# Patient Record
Sex: Female | Born: 1995 | Hispanic: No | Marital: Single | Smoking: Never smoker
Health system: Southern US, Community
[De-identification: ages and names within clinical notes are randomized; demographics above are authoritative.]

## PROBLEM LIST (undated history)

## (undated) DIAGNOSIS — R109 Unspecified abdominal pain: Secondary | ICD-10-CM

## (undated) DIAGNOSIS — N939 Abnormal uterine and vaginal bleeding, unspecified: Secondary | ICD-10-CM

## (undated) HISTORY — DX: Unspecified abdominal pain: R10.9

## (undated) HISTORY — DX: Abnormal uterine and vaginal bleeding, unspecified: N93.9

---
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
## 2015-03-10 ENCOUNTER — Ambulatory Visit: Payer: Medicaid Other | Admitting: Pediatrics

## 2015-03-31 ENCOUNTER — Ambulatory Visit: Payer: Medicaid Other | Admitting: Pediatrics

## 2015-03-31 ENCOUNTER — Encounter: Payer: Self-pay | Admitting: Pediatrics

## 2015-03-31 NOTE — Progress Notes (Unsigned)
Pre-Visit Planning  Destynie Toomey  is a 20 y.o. female referred by Venia Minks, MD.   Last seen in Adolescent Medicine Clinic on 10/12/ for ***.   Previous Psych Screenings? {Yes/No-Ex:120004}  Treatment plan at last visit included ***.   Clinical Staff Visit Tasks:   - Urine GC/CT due? {Yes/No-Ex:120004} - Psych Screenings Due? {Yes/No-Ex:120004} - ***  Provider Visit Tasks: - *** [facility] Involvement? {Responses; yes/no/unknown/maybe/na:33144} - Pertinent Labs? {Yes/No-Ex:120004}

## 2015-09-24 ENCOUNTER — Encounter: Payer: Self-pay | Admitting: Pediatrics

## 2015-09-25 ENCOUNTER — Encounter: Payer: Self-pay | Admitting: Pediatrics

## 2015-12-25 ENCOUNTER — Encounter: Payer: Self-pay | Admitting: Pediatrics

## 2015-12-25 ENCOUNTER — Ambulatory Visit (INDEPENDENT_AMBULATORY_CARE_PROVIDER_SITE_OTHER): Payer: Medicaid Other | Admitting: Pediatrics

## 2015-12-25 VITALS — BP 120/69 | HR 76 | Ht 63.0 in | Wt 155.0 lb

## 2015-12-25 DIAGNOSIS — Z978 Presence of other specified devices: Secondary | ICD-10-CM

## 2015-12-25 DIAGNOSIS — Z113 Encounter for screening for infections with a predominantly sexual mode of transmission: Secondary | ICD-10-CM

## 2015-12-25 DIAGNOSIS — N921 Excessive and frequent menstruation with irregular cycle: Secondary | ICD-10-CM | POA: Diagnosis not present

## 2015-12-25 DIAGNOSIS — Z130 Encounter for screening for diseases of the blood and blood-forming organs and certain disorders involving the immune mechanism: Secondary | ICD-10-CM

## 2015-12-25 DIAGNOSIS — Z3046 Encounter for surveillance of implantable subdermal contraceptive: Secondary | ICD-10-CM

## 2015-12-25 DIAGNOSIS — Z975 Presence of (intrauterine) contraceptive device: Principal | ICD-10-CM

## 2015-12-25 LAB — POCT HEMOGLOBIN: Hemoglobin: 13.2 g/dL (ref 12.2–16.2)

## 2015-12-25 MED ORDER — NORETHINDRONE ACET-ETHINYL EST 1.5-30 MG-MCG PO TABS: 1.0000 | ORAL_TABLET | Freq: Every day | ORAL | 2 refills | Status: DC

## 2015-12-25 NOTE — Patient Instructions (Signed)
Call us if you need us.  If bleeding doesn't stop, let us know.  I will call you with labs tomorrow

## 2015-12-25 NOTE — Progress Notes (Signed)
THIS RECORD MAY CONTAIN CONFIDENTIAL INFORMATION THAT SHOULD NOT BE RELEASED WITHOUT REVIEW OF THE SERVICE PROVIDER.  Adolescent Medicine Consultation Follow-Up Visit Nicole Ochoa  is a 20 y.o. female referred by Marijo File, MD here today for follow-up regarding nexplanon.    Last seen in Adolescent Medicine Clinic on 12/11/14 for breakthrough bleeding with nexplanon.  Plan at last visit included start junel for 2 months for uterine stabilization.  - Pertinent Labs? No - Growth Chart Viewed? yes   History was provided by the patient.  PCP Confirmed?  yes  My Chart Activated?   no   Chief Complaint  Patient presents with  . Follow-up  . Vaginal Bleeding    HPI:    Breakthrough bleeding started again 2 months ago. She describes it as really heavy-- 4 pads a day every day. For the last two weeks or so she has had cramping. She had about 4 months with no bleeding.  Not sexually active at all. She describes that sometimes there is a smell.  The pills helped last time for about a month and then ran out.   Review of Systems  Constitutional: Negative for malaise/fatigue.  Eyes: Negative for double vision.  Respiratory: Negative for shortness of breath.   Cardiovascular: Negative for chest pain and palpitations.  Gastrointestinal: Negative for abdominal pain, constipation, diarrhea, nausea and vomiting.  Genitourinary: Negative for dysuria.  Musculoskeletal: Negative for joint pain and myalgias.  Skin: Negative for rash.  Neurological: Negative for dizziness and headaches.  Endo/Heme/Allergies: Does not bruise/bleed easily.     No LMP recorded. No Known Allergies Outpatient Medications Prior to Visit  Medication Sig Dispense Refill  . ibuprofen (ADVIL,MOTRIN) 600 MG tablet Take 1 tablet (600 mg total) by mouth every 6 (six) hours as needed for cramping. 30 tablet 0  . Norethindrone Acetate-Ethinyl Estradiol (JUNEL,LOESTRIN,MICROGESTIN) 1.5-30 MG-MCG tablet Take 1 tablet  by mouth daily. (Patient not taking: Reported on 12/25/2015) 1 Package 2   No facility-administered medications prior to visit.      Patient Active Problem List   Diagnosis Date Noted  . Breakthrough bleeding on Nexplanon 12/25/2015  . Surveillance of previously prescribed implantable subdermal contraceptive 12/11/2014  . Abdominal pain, unspecified site 06/29/2013  . Unspecified constipation 06/29/2013  . Episode of heavy vaginal bleeding 05/30/2013     The following portions of the patient's history were reviewed and updated as appropriate: allergies, current medications, past family history, past medical history, past social history and problem list.  Physical Exam:  Vitals:   12/25/15 1545  BP: 120/69  Pulse: 76  Weight: 155 lb (70.3 kg)  Height: 5\' 3"  (1.6 m)   BP 120/69   Pulse 76   Ht 5\' 3"  (1.6 m)   Wt 155 lb (70.3 kg)   BMI 27.46 kg/m  Body mass index: body mass index is 27.46 kg/m. Growth percentile SmartLinks can only be used for patients less than 5 years old.   Physical Exam  Constitutional: She appears well-developed. No distress.  HENT:  Mouth/Throat: Oropharynx is clear and moist.  Neck: No thyromegaly present.  Cardiovascular: Normal rate and regular rhythm.   No murmur heard. Pulmonary/Chest: Breath sounds normal.  Abdominal: Soft. She exhibits no mass. There is no tenderness. There is no guarding.  Musculoskeletal: She exhibits no edema.  Lymphadenopathy:    She has no cervical adenopathy.  Neurological: She is alert.  Skin: Skin is warm. No rash noted.  Psychiatric: She has a normal mood and affect.  Nursing note and vitals reviewed.   Assessment/Plan: 1. Breakthrough bleeding on Nexplanon BTB has returned. Will screen for infection and treat with another round of OCP. Patient is satisfied with this.  - WET PREP BY MOLECULAR PROBE - Norethindrone Acetate-Ethinyl Estradiol (JUNEL,LOESTRIN,MICROGESTIN) 1.5-30 MG-MCG tablet; Take 1 tablet by  mouth daily.  Dispense: 1 Package; Refill: 2  2. Surveillance of previously prescribed implantable subdermal contraceptive Patient satisfied with contraceptive device just wants to stop bleeding again.  - POCT hemoglobin  3. Routine screening for STI (sexually transmitted infection) Per BTB protocol.  - GC/Chlamydia Probe Amp  4. Screening for iron deficiency anemia Hemoglobin normal- not bleeding too heavily. Will monitor.    Follow-up:  PRN if bleeding does not improve or returns.   Medical decision-making:  >15 minutes spent face to face with patient with more than 50% of appointment spent discussing diagnosis, management, follow-up, and reviewing the plan of care as noted above.

## 2015-12-26 LAB — GC/CHLAMYDIA PROBE AMP
CT PROBE, AMP APTIMA: NOT DETECTED
GC Probe RNA: NOT DETECTED

## 2015-12-26 LAB — WET PREP BY MOLECULAR PROBE
CANDIDA SPECIES: NEGATIVE
Gardnerella vaginalis: NEGATIVE
TRICHOMONAS VAG: NEGATIVE

## 2015-12-29 NOTE — Progress Notes (Signed)
TC to mobile phone number x2.  No longer in service. Unable to LVM.   TC to home phone.  Female picked up, stating pt no longer available at this phone number.

## 2016-02-20 ENCOUNTER — Other Ambulatory Visit: Payer: Self-pay | Admitting: Pediatrics

## 2016-02-20 ENCOUNTER — Ambulatory Visit (INDEPENDENT_AMBULATORY_CARE_PROVIDER_SITE_OTHER): Payer: Medicaid Other | Admitting: Pediatrics

## 2016-02-20 ENCOUNTER — Encounter: Payer: Self-pay | Admitting: Pediatrics

## 2016-02-20 VITALS — BP 110/65 | Wt 155.4 lb

## 2016-02-20 DIAGNOSIS — N921 Excessive and frequent menstruation with irregular cycle: Secondary | ICD-10-CM

## 2016-02-20 DIAGNOSIS — R109 Unspecified abdominal pain: Secondary | ICD-10-CM | POA: Diagnosis not present

## 2016-02-20 DIAGNOSIS — R5383 Other fatigue: Secondary | ICD-10-CM

## 2016-02-20 DIAGNOSIS — Z3202 Encounter for pregnancy test, result negative: Secondary | ICD-10-CM

## 2016-02-20 DIAGNOSIS — Z978 Presence of other specified devices: Secondary | ICD-10-CM | POA: Diagnosis not present

## 2016-02-20 DIAGNOSIS — N926 Irregular menstruation, unspecified: Secondary | ICD-10-CM

## 2016-02-20 DIAGNOSIS — Z975 Presence of (intrauterine) contraceptive device: Principal | ICD-10-CM

## 2016-02-20 LAB — POCT HEMOGLOBIN: HEMOGLOBIN: 13.6 g/dL (ref 12.2–16.2)

## 2016-02-20 LAB — POCT URINE PREGNANCY: Preg Test, Ur: NEGATIVE

## 2016-02-20 MED ORDER — NORETHINDRONE ACET-ETHINYL EST 1.5-30 MG-MCG PO TABS: 1.0000 | ORAL_TABLET | Freq: Every day | ORAL | 2 refills | Status: DC

## 2016-02-20 MED ORDER — IBUPROFEN 600 MG PO TABS: 600.0000 mg | ORAL_TABLET | Freq: Four times a day (QID) | ORAL | 0 refills | Status: DC | PRN

## 2016-02-20 NOTE — Progress Notes (Signed)
    Subjective:    Nicole Ochoa is a 20 y.o. female accompanied by self presenting to the clinic today with a chief c/o of Chief Complaint  Patient presents with  . Menstrual Problem    pt stated that she has been bleeding x2weeks    Nexplanon implant followed by Nicole Ochoa  No follow up scheduled per patient.  Bleeding daily x 2 weeks, using 3 pads per day.  Gradual decrease in the bleeding  Finished Junel prescription 2 weeks ago. Low back cramps from  Vaginal bleeding Feeling tired x 2 weeks since onset of vaginal bleeing.  Last Hbg 10/17  13.2  Denies fever or illness.  Appetite is normal.  No dysuria.  Denies sexual activity.   Review of Systems  Constitutional: Negative.   HENT: Negative.   Eyes: Negative.   Respiratory: Negative.   Cardiovascular: Negative.   Musculoskeletal: Negative.   Skin: Negative.   Neurological: Negative.   Psychiatric/Behavioral: Negative.        Objective:   Physical Exam  Constitutional: She is oriented to person, place, and time. She appears well-developed and well-nourished.  HENT:  Head: Normocephalic and atraumatic.  Right Ear: External ear normal.  Left Ear: External ear normal.  Nose: Nose normal.  Mouth/Throat: Oropharynx is clear and moist.  Eyes: Conjunctivae are normal. Pupils are equal, round, and reactive to light.  Neck: Normal range of motion. Neck supple.  Cardiovascular: Normal rate, regular rhythm and normal heart sounds.   No murmur heard. Pulmonary/Chest: Effort normal and breath sounds normal. No respiratory distress. She has no rales.  Abdominal: Soft. Bowel sounds are normal. She exhibits no mass. There is no tenderness.  Musculoskeletal:  Denies LS pain  Lymphadenopathy:    She has no cervical adenopathy.  Neurological: She is alert and oriented to person, place, and time.  Skin: Skin is warm and dry. No rash noted.  Nexplanon palpated in left upper arm ~ 5 cm above elbow  Psychiatric: She has a  normal mood and affect. Her behavior is normal.  Nursing note and vitals reviewed.  .BP 110/65   Wt 155 lb 6.4 oz (70.5 kg)   LMP 02/06/2016   BMI 27.53 kg/m         Assessment & Plan:  There are no diagnoses linked to this encounter. 1. Breakthrough bleeding on Nexplanon Restart, refill sent - Norethindrone Acetate-Ethinyl Estradiol (JUNEL,LOESTRIN,MICROGESTIN) 1.5-30 MG-MCG tablet; Take 1 tablet by mouth daily.  Dispense: 1 Package; Refill: 2  2. Irregular menses - POCT urine pregnancy - negative; reviewed results Restart, refill sent - Norethindrone Acetate-Ethinyl Estradiol (JUNEL,LOESTRIN,MICROGESTIN) 1.5-30 MG-MCG tablet; Take 1 tablet by mouth daily.  Dispense: 1 Package; Refill: 2  3. Abdominal cramping Instructed to take as needed with food to minimize gastic discomfort - ibuprofen (ADVIL,MOTRIN) 600 MG tablet; Take 1 tablet (600 mg total) by mouth every 6 (six) hours as needed for cramping.  Dispense: 30 tablet; Refill: 0  4. Other fatigue - POCT hemoglobin, 13.6, fatigue not due to excessive blood loss from vaginal bleeding. Encouraged adequate rest.  Follow up with C. Millikin for discussion about management plan for breakthrough bleeding in next 2-4 weeks.   Nicole Ochoa verbalizes understanding with plan and questions addressed.  Nicole CasinoLaura Anastasios Melander MSN, CPNP, CDE 02/20/2016 5:02 PM

## 2016-02-20 NOTE — Patient Instructions (Signed)
Junel - restart and take one pill daily  Motrin 600 mg may take 1 every 8 hours with food for abdominal cramps.  Keep appointment with Garth Schlatterhristy Millikan

## 2016-03-23 ENCOUNTER — Ambulatory Visit: Payer: Self-pay | Admitting: Family

## 2016-05-18 ENCOUNTER — Encounter: Payer: Self-pay | Admitting: Pediatrics

## 2016-05-18 ENCOUNTER — Ambulatory Visit (INDEPENDENT_AMBULATORY_CARE_PROVIDER_SITE_OTHER): Payer: Medicaid Other | Admitting: Pediatrics

## 2016-05-18 VITALS — BP 86/49 | HR 84 | Ht 63.5 in | Wt 154.8 lb

## 2016-05-18 DIAGNOSIS — Z978 Presence of other specified devices: Secondary | ICD-10-CM

## 2016-05-18 DIAGNOSIS — N921 Excessive and frequent menstruation with irregular cycle: Secondary | ICD-10-CM

## 2016-05-18 DIAGNOSIS — Z975 Presence of (intrauterine) contraceptive device: Principal | ICD-10-CM

## 2016-05-18 DIAGNOSIS — N926 Irregular menstruation, unspecified: Secondary | ICD-10-CM

## 2016-05-18 DIAGNOSIS — Z130 Encounter for screening for diseases of the blood and blood-forming organs and certain disorders involving the immune mechanism: Secondary | ICD-10-CM

## 2016-05-18 LAB — POCT HEMOGLOBIN: Hemoglobin: 13.3 g/dL (ref 12.2–16.2)

## 2016-05-18 MED ORDER — NORETHINDRONE ACET-ETHINYL EST 1.5-30 MG-MCG PO TABS: 1.0000 | ORAL_TABLET | Freq: Every day | ORAL | 2 refills | Status: DC

## 2016-05-18 NOTE — Progress Notes (Signed)
THIS RECORD MAY CONTAIN CONFIDENTIAL INFORMATION THAT SHOULD NOT BE RELEASED WITHOUT REVIEW OF THE SERVICE PROVIDER.  Adolescent Medicine Consultation Follow-Up Visit Nicole Ochoa  is a 21 y.o. female referred by Nicole Ochoa, Nicole V, MD here today for follow-up regarding breakthrough bleeding with nexplanon   Last seen in [facility] on 12/25/2015 for the above.  Plan at last visit included continue Junel for BTB. Was then seen by Pixie CasinoLaura Ochoa for the same in December. Was having fatigue but hgb was normal at 13.7.  - Pertinent Labs? No - Growth Chart Viewed? yes   History was provided by the patient.  PCP Confirmed?  yes  My Chart Activated?   no  Chief Complaint  Patient presents with  . Follow-up  . reproductive health    HPI:    Continues with BTB on nexplanon. Still happy with method but would like to continue OCP to manage bleeding.  Not sexually active and has not been since last visit.  No other vaginal symptoms.  Is in school for medical assistant.   Review of Systems  Constitutional: Negative for malaise/fatigue.  Eyes: Negative for double vision.  Respiratory: Negative for shortness of breath.   Cardiovascular: Negative for chest pain and palpitations.  Gastrointestinal: Negative for abdominal pain, constipation, diarrhea, nausea and vomiting.  Genitourinary: Negative for dysuria.  Musculoskeletal: Negative for joint pain and myalgias.  Skin: Negative for rash.  Neurological: Negative for dizziness and headaches.  Endo/Heme/Allergies: Does not bruise/bleed easily.     Patient's last menstrual period was 04/28/2016. No Known Allergies Outpatient Medications Prior to Visit  Medication Sig Dispense Refill  . ibuprofen (ADVIL,MOTRIN) 600 MG tablet Take 1 tablet (600 mg total) by mouth every 6 (six) hours as needed for cramping. 30 tablet 0  . Norethindrone Acetate-Ethinyl Estradiol (JUNEL,LOESTRIN,MICROGESTIN) 1.5-30 MG-MCG tablet Take 1 tablet  by mouth daily. (Patient not taking: Reported on 05/18/2016) 1 Package 2   No facility-administered medications prior to visit.      Patient Active Problem List   Diagnosis Date Noted  . Breakthrough bleeding on Nexplanon 12/25/2015  . Surveillance of previously prescribed implantable subdermal contraceptive 12/11/2014  . Abdominal pain, unspecified site 06/29/2013  . Unspecified constipation 06/29/2013  . Episode of heavy vaginal bleeding 05/30/2013    The following portions of the patient's history were reviewed and updated as appropriate: allergies, current medications, past family history, past medical history, past social history and problem list.  Physical Exam:  Vitals:   05/18/16 0849  Weight: 154 lb 12.8 oz (70.2 kg)  Height: 5' 3.5" (1.613 m)   Ht 5' 3.5" (1.613 m)   Wt 154 lb 12.8 oz (70.2 kg)   LMP 04/28/2016   BMI 26.99 kg/m  Body mass index: body mass index is 26.99 kg/m. Growth percentile SmartLinks can only be used for patients less than 21 years old.   Physical Exam  Constitutional: She appears well-developed. No distress.  HENT:  Mouth/Throat: Oropharynx is clear and moist.  Neck: No thyromegaly present.  Cardiovascular: Normal rate and regular rhythm.   No murmur heard. Pulmonary/Chest: Breath sounds normal.  Abdominal: Soft. She exhibits no mass. There is no tenderness. There is no guarding.  Musculoskeletal: She exhibits no edema.  Lymphadenopathy:    She has no cervical adenopathy.  Neurological: She is alert.  Skin: Skin is warm. No rash noted.  Psychiatric: She has a normal mood and affect.  Nursing note and vitals reviewed.   Assessment/Plan: 1. Breakthrough bleeding on Nexplanon Continue  OCP. No concerns for infection. We discussed other methods but she would like to continue with this for now.  - Norethindrone Acetate-Ethinyl Estradiol (JUNEL,LOESTRIN,MICROGESTIN) 1.5-30 MG-MCG tablet; Take 1 tablet by mouth daily.  Dispense: 3 Package;  Refill: 2  2. Irregular menses As above.  - Norethindrone Acetate-Ethinyl Estradiol (JUNEL,LOESTRIN,MICROGESTIN) 1.5-30 MG-MCG tablet; Take 1 tablet by mouth daily.  Dispense: 3 Package; Refill: 2  3. Screening for iron deficiency anemia Hgb 13.3.    Follow-up:  As needed for BTB or method change   Medical decision-making:  >15 minutes spent face to face with patient with more than 50% of appointment spent discussing diagnosis, management, follow-up, and reviewing of breakthrough bleeding on nexplanon.

## 2016-05-18 NOTE — Patient Instructions (Signed)
Continue taking birth control pills for bleeding.  If you decide you want to take nexplanon out and try a different method let us know.

## 2017-03-21 ENCOUNTER — Ambulatory Visit (INDEPENDENT_AMBULATORY_CARE_PROVIDER_SITE_OTHER): Payer: Medicaid Other | Admitting: Pediatrics

## 2017-03-21 ENCOUNTER — Encounter: Payer: Self-pay | Admitting: Pediatrics

## 2017-03-21 VITALS — BP 113/64 | HR 66 | Ht 63.78 in | Wt 168.6 lb

## 2017-03-21 DIAGNOSIS — N921 Excessive and frequent menstruation with irregular cycle: Secondary | ICD-10-CM

## 2017-03-21 DIAGNOSIS — Z113 Encounter for screening for infections with a predominantly sexual mode of transmission: Secondary | ICD-10-CM | POA: Diagnosis not present

## 2017-03-21 DIAGNOSIS — Z3046 Encounter for surveillance of implantable subdermal contraceptive: Secondary | ICD-10-CM | POA: Diagnosis not present

## 2017-03-21 DIAGNOSIS — N946 Dysmenorrhea, unspecified: Secondary | ICD-10-CM

## 2017-03-21 DIAGNOSIS — Z978 Presence of other specified devices: Secondary | ICD-10-CM | POA: Diagnosis not present

## 2017-03-21 DIAGNOSIS — Z130 Encounter for screening for diseases of the blood and blood-forming organs and certain disorders involving the immune mechanism: Secondary | ICD-10-CM

## 2017-03-21 DIAGNOSIS — Z975 Presence of (intrauterine) contraceptive device: Secondary | ICD-10-CM

## 2017-03-21 LAB — POCT HEMOGLOBIN: HEMOGLOBIN: 15.9 g/dL (ref 12.2–16.2)

## 2017-03-21 MED ORDER — NAPROXEN 500 MG PO TABS: 500.0000 mg | ORAL_TABLET | Freq: Two times a day (BID) | ORAL | 2 refills | Status: DC

## 2017-03-21 NOTE — Patient Instructions (Signed)
Follow-up with Dr. Perry in 1 month. Schedule this appointment before you leave clinic today.  Your Nexplanon was removed today and is no longer preventing pregnancy.  If you have sex, remember to use condoms to prevent pregnancy and to prevent sexually transmitted infections.  Leave the outside bandage on for 24 hours.  Leave the smaller bandages on for 3-5 days or until they fall off on their own.  Keep the area clean and dry for 3-5 days.  There is usually bruising or swelling at and around the removal site for a few days to a week after the removal.  If you see redness or pus draining from the removal site, call us immediately.  We would like you to return to the clinic for a follow-up visit in 1 month.  You can call [facility] 24 hours a day with any questions or concerns.  There is always a nurse or doctor available to take your call.  Call 9-1-1 if you have a life-threatening emergency.  For anything else, please call us at [phone] before heading to the ER.  

## 2017-03-21 NOTE — Progress Notes (Signed)
THIS RECORD MAY CONTAIN CONFIDENTIAL INFORMATION THAT SHOULD NOT BE RELEASED WITHOUT REVIEW OF THE SERVICE PROVIDER.  Adolescent Medicine Consultation Follow-Up Visit Nicole Ochoa  is a 22 y.o. female referred by Marijo File, MD here today for follow-up regarding breakthrough bleeding.    Last seen in [facility] Clinic on 05/18/16 for breakthrough bleeding on nexplanon.  Plan at last visit included continued OCP in addition to nexplanon, follow up as needed.  Pertinent Labs? Yes, hemoglobin normal Growth Chart Viewed? yes   History was provided by the patient.  Interpreter? yes  PCP Confirmed?  yes  My Chart Activated?   no   Chief Complaint  Patient presents with  . Follow-up    prlonged bleeding and upper arm pain    HPI:    Presents w/ complaints of BTB on nexplanon. She has been having 2 weeks of bleeding on her most recent period. First week she was using 3 tampons/day, this week using 2 tampons/day. No leaking through these. She also complaints of low back pain, weight gain (30 pounds since nexplanon put in), arm pain, and mood swings. She would like nexplanon removed at this point. She is unsure about other forms of birth control going forward. She does not want any of these side effects to continue. She is not currently sexually active. The birth control was always in place for heavy bleeding on her menses.   No LMP recorded. No Known Allergies Outpatient Medications Prior to Visit  Medication Sig Dispense Refill  . ibuprofen (ADVIL,MOTRIN) 600 MG tablet Take 1 tablet (600 mg total) by mouth every 6 (six) hours as needed for cramping. 30 tablet 0  . Norethindrone Acetate-Ethinyl Estradiol (JUNEL,LOESTRIN,MICROGESTIN) 1.5-30 MG-MCG tablet Take 1 tablet by mouth daily. 3 Package 2   No facility-administered medications prior to visit.      Patient Active Problem List   Diagnosis Date Noted  . Breakthrough bleeding on Nexplanon 12/25/2015  . Surveillance  of previously prescribed implantable subdermal contraceptive 12/11/2014  . Abdominal pain, unspecified site 06/29/2013  . Unspecified constipation 06/29/2013    Social History: Changes with school since last visit?  yes, in MA program  The following portions of the patient's history were reviewed and updated as appropriate: allergies, current medications, past family history, past medical history, past social history, past surgical history and problem list.  Physical Exam:  Vitals:   03/21/17 1110  BP: 113/64  Pulse: 66  Weight: 168 lb 9.6 oz (76.5 kg)  Height: 5' 3.78" (1.62 m)   BP 113/64   Pulse 66   Ht 5' 3.78" (1.62 m)   Wt 168 lb 9.6 oz (76.5 kg)   BMI 29.14 kg/m  Body mass index: body mass index is 29.14 kg/m. Growth percentile SmartLinks can only be used for patients less than 15 years old.  Physical Exam  Constitutional: She is oriented to person, place, and time. She appears well-developed and well-nourished. No distress.  HENT:  Head: Normocephalic and atraumatic.  Eyes: Conjunctivae are normal. No scleral icterus.  Cardiovascular: Normal rate and regular rhythm.  Pulmonary/Chest: Effort normal. No respiratory distress.  Musculoskeletal: Normal range of motion.       Lumbar back: She exhibits tenderness.  Neurological: She is alert and oriented to person, place, and time. No cranial nerve deficit.  Skin: Skin is warm and dry.  Psychiatric: She has a normal mood and affect.    Assessment/Plan: 1. Breakthrough bleeding on Nexplanon Nexplanon removed today in office. Will trial 6-8  weeks off birth control to get a sense of bleeding at baseline.  -return to be seen if heavy bleeding occurs (>6 pads/tampon a day) -[facility] -follow up 6-8 weeks to discuss need for other birth control if heavy bleeding persists; hormone patch or ring may be good options vs IUD if patient is amenable   2. Routine screening for STI (sexually transmitted  infection) Patient not sexually active - C. trachomatis/N. gonorrhoeae RNA  3. Screening for deficiency anemia Hemoglobin in normal range today. - POCT hemoglobin  4. Dysmenorrhea Cramping on period.  - naproxen (NAPROSYN) 500 MG tablet; Take 1 tablet (500 mg total) by mouth 2 (two) times daily with a meal.  Dispense: 60 tablet; Refill: 2   Follow-up:  Return in about 6 weeks (around 05/02/2017).   Medical decision-making:  >25 minutes spent face to face with patient with more than 50% of appointment spent discussing diagnosis, management, follow-up, and reviewing of breakthrough bleeding on nexplanon.

## 2017-07-13 ENCOUNTER — Ambulatory Visit (HOSPITAL_COMMUNITY)
Admission: EM | Admit: 2017-07-13 | Discharge: 2017-07-13 | Disposition: A | Discharge status: Home / Self Care | Payer: Self-pay | Attending: Family Medicine | Admitting: Family Medicine

## 2017-07-13 ENCOUNTER — Other Ambulatory Visit: Payer: Self-pay

## 2017-07-13 ENCOUNTER — Ambulatory Visit (INDEPENDENT_AMBULATORY_CARE_PROVIDER_SITE_OTHER): Payer: Self-pay

## 2017-07-13 ENCOUNTER — Encounter (HOSPITAL_COMMUNITY): Payer: Self-pay | Admitting: Emergency Medicine

## 2017-07-13 DIAGNOSIS — S161XXA Strain of muscle, fascia and tendon at neck level, initial encounter: Secondary | ICD-10-CM

## 2017-07-13 DIAGNOSIS — M25571 Pain in right ankle and joints of right foot: Secondary | ICD-10-CM
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
## 2017-07-13 NOTE — Discharge Instructions (Addendum)
You may use over the counter ibuprofen or acetaminophen as needed.  ° °

## 2017-09-19 ENCOUNTER — Ambulatory Visit: Payer: Medicaid Other | Admitting: Pediatrics

## 2017-09-22 ENCOUNTER — Encounter: Payer: Self-pay | Admitting: Pediatrics

## 2017-09-22 ENCOUNTER — Ambulatory Visit (INDEPENDENT_AMBULATORY_CARE_PROVIDER_SITE_OTHER): Payer: Medicaid Other | Admitting: Pediatrics

## 2017-09-22 VITALS — BP 109/70 | HR 85 | Ht 63.39 in | Wt 156.2 lb

## 2017-09-22 DIAGNOSIS — Z130 Encounter for screening for diseases of the blood and blood-forming organs and certain disorders involving the immune mechanism: Secondary | ICD-10-CM | POA: Diagnosis not present

## 2017-09-22 DIAGNOSIS — R51 Headache: Secondary | ICD-10-CM

## 2017-09-22 DIAGNOSIS — Z3202 Encounter for pregnancy test, result negative: Secondary | ICD-10-CM

## 2017-09-22 DIAGNOSIS — N946 Dysmenorrhea, unspecified: Secondary | ICD-10-CM

## 2017-09-22 DIAGNOSIS — Z1389 Encounter for screening for other disorder: Secondary | ICD-10-CM

## 2017-09-22 DIAGNOSIS — G4700 Insomnia, unspecified: Secondary | ICD-10-CM

## 2017-09-22 DIAGNOSIS — R42 Dizziness and giddiness: Secondary | ICD-10-CM

## 2017-09-22 DIAGNOSIS — Z113 Encounter for screening for infections with a predominantly sexual mode of transmission: Secondary | ICD-10-CM

## 2017-09-22 DIAGNOSIS — R519 Headache, unspecified: Secondary | ICD-10-CM

## 2017-09-22 DIAGNOSIS — Z3049 Encounter for surveillance of other contraceptives: Secondary | ICD-10-CM

## 2017-09-22 DIAGNOSIS — N760 Acute vaginitis: Secondary | ICD-10-CM

## 2017-09-22 LAB — POCT URINALYSIS DIPSTICK
BILIRUBIN UA: NEGATIVE
GLUCOSE UA: NEGATIVE
Nitrite, UA: NEGATIVE
Protein, UA: POSITIVE — AB
RBC UA: NEGATIVE
SPEC GRAV UA: 1.02 (ref 1.010–1.025)
Urobilinogen, UA: 0.2 E.U./dL
pH, UA: 5 (ref 5.0–8.0)

## 2017-09-22 LAB — POCT URINE PREGNANCY: PREG TEST UR: NEGATIVE

## 2017-09-22 LAB — POCT HEMOGLOBIN: HEMOGLOBIN: 14.4 g/dL (ref 12.2–16.2)

## 2017-09-22 MED ORDER — FLUCONAZOLE 150 MG PO TABS
150.0000 mg | ORAL_TABLET | Freq: Every day | ORAL | 0 refills | Status: DC
Start: 2017-09-22 — End: 2017-09-23

## 2017-09-22 MED ORDER — NORETHIN ACE-ETH ESTRAD-FE 1.5-30 MG-MCG PO TABS: 1.0000 | ORAL_TABLET | Freq: Every day | ORAL | 11 refills | Status: DC

## 2017-09-22 NOTE — Progress Notes (Signed)
THIS RECORD MAY CONTAIN CONFIDENTIAL INFORMATION THAT SHOULD NOT BE RELEASED WITHOUT REVIEW OF THE SERVICE PROVIDER.  Adolescent Medicine Consultation Follow-Up Visit Nicole Ochoa  is a 22 y.o. female referred by Marijo File, MD here today for follow-up, but has multiple concerns.  Last seen in [facility] on 03/21/2017 for nexplanon removal after persistent breakthrough bleeding.  Plan at last visit included trial of 6-8weeks off birth control to reassess bleeding baseline.   Pertinent Labs? Yes Growth Chart Viewed? yes   History was provided by the patient.  Interpreter? no  PCP Confirmed?  yes  My Chart Activated?   yes  Patient's personal or confidential phone number: [phone]  Chief Complaint  Patient presents with  . Follow-up    HPI:  Nicole Ochoa is a 22yr old female with hx of dysmenorrhea who is here for multiple concerns:  1) Insomnia: Trouble falling asleep x 3months. Will try to go to sleep but her brain "keeps going." Can't tell what she's thinking about. Doesn't think she is anxious or worried. Working part time as Science writer. No recent stressors. Tries to go to bed at 10pm, but then just lays there until 4 am when she falls asleep until 8am. Denies any nightmares or restless legs. Started OTC med Neuriva (coffee arabica fruit) 2 weeks ago is taking before bed and thinks it is helping some.  Still very tired during the day. Naps 5pm-6pm. No TV in room. When she can't sleep, gets back on her phone. Lives with her mom, cool quiet bedroom. Cannot recall anything that happened or changed prior to onset of insomnia. No fam hx of insomnia or sleep disorders.  2) Dizzy/headaches- started at the same time as insomnia, worse shortly after she wakes up and after shower. Also happens sometimes when she stands up too fast. Also gets a headache with her dizziness. Sits down and drinks water and both headaches and dizziness resolve in <31min. Hx of headaches similar to  these but can't remember when or what helped to make them go away. Doesn't take any meds for headaches. Does not have a headache which wakes her up in the morning. No numbness/tingling in extremities. No vision changes. No syncope. Occasional fast heart beat, but not always with dizziness/headaches. No chest pain or difficulties breathing.  3) Vaginal erythema, itching, and warmth, burning with urination x 2 weeks. No bad odors or irregular discharge. No fevers or pelvic pain. Not sexually active, is a virgin. No change in bath or soap products. No douching or foreign objects. Bought OTC cream for vaginal itching (unknown kind) - helped some, still using, but still has itching and irritation. No hx of similar symptoms.  4) Periods - First period after nexplanon removal was in April - heavy bleeding, lasting 8 days. Same in May. Cramping and bleeding is worse than before nexplanon. June - 3 days heavy bleeding, but then stopped and returned 2 weeks later, light bleeding x 4 days. Would like to restart [facility] for heavy bleeding and cramping. Would like OCP but then will think about IUD.   Review of Systems  Constitutional: Negative for chills and fever.  HENT: Negative for congestion and ear pain.   Eyes: Negative for blurred vision, double vision and pain.  Respiratory: Negative for cough, sputum production, shortness of breath and wheezing.   Cardiovascular: Positive for palpitations (occasional fast heart beat). Negative for chest pain and orthopnea.  Gastrointestinal: Negative for abdominal pain, blood in stool, constipation, diarrhea, nausea and vomiting.  Genitourinary: Positive for dysuria. Negative for frequency and urgency.  Musculoskeletal: Negative for myalgias.  Neurological: Positive for dizziness and headaches. Negative for tingling, focal weakness, loss of consciousness and weakness.  Psychiatric/Behavioral: Negative for depression.    No LMP recorded. No Known Allergies No outpatient  medications prior to visit.   No facility-administered medications prior to visit.      Patient Active Problem List   Diagnosis Date Noted  . Dysmenorrhea 03/21/2017  . Abdominal pain, unspecified site 06/29/2013  . Unspecified constipation 06/29/2013    Social History: Changes with school since last visit?  College - in [place] in Fall for BSN program  Activities:  Special interests/hobbies/sports: none  Lifestyle habits that can impact QOL: Sleep:poor, see above Eating habits/patterns: 3 meals/day Water intake: 3-4bottles/day Caffeine: none Screen time: >2hrs Exercise: none  Confidentiality was discussed with the patient and if applicable, with caregiver as well.  Changes at home or school since last visit:  no  Gender identity: female Sex assigned at birth: female Pronouns: she Tobacco?  no Drugs/ETOH?  no Partner preference?  female  Sexually Active?  no  Pregnancy Prevention:  none Reviewed condoms:  no Reviewed EC:  no   Suicidal or homicidal thoughts?   no Self injurious behaviors?  no  Physical Exam:  Vitals:   09/22/17 0841  BP: 101/63  Pulse: 69  Weight: 156 lb 3.2 oz (70.9 kg)  Height: 5' 3.39" (1.61 m)   BP 101/63   Pulse 69   Ht 5' 3.39" (1.61 m)   Wt 156 lb 3.2 oz (70.9 kg)   BMI 27.33 kg/m  Body mass index: body mass index is 27.33 kg/m. Growth percentile SmartLinks can only be used for patients less than 22 years old.  Physical Exam  Constitutional: She appears well-developed and well-nourished. No distress.  HENT:  Head: Normocephalic.  Right Ear: External ear normal.  Left Ear: External ear normal.  Nose: Nose normal.  Mouth/Throat: Oropharynx is clear and moist. No oropharyngeal exudate.  Eyes: Pupils are equal, round, and reactive to light. Conjunctivae and EOM are normal. Right eye exhibits no discharge. Left eye exhibits no discharge.  Neck: Normal range of motion. No thyromegaly (no tenderness or nodules) present.   Cardiovascular: Normal rate, regular rhythm and normal heart sounds. Exam reveals no gallop and no friction rub.  No murmur heard. Pulmonary/Chest: Effort normal and breath sounds normal. No respiratory distress. She has no wheezes. She has no rales.  Abdominal: Soft. Bowel sounds are normal. She exhibits no distension. There is no tenderness. There is no rebound and no guarding.  Genitourinary: There is tenderness (tender on inner portion of labia minora bilaterally) on the right labia. There is no rash, lesion or injury on the right labia. There is tenderness on the left labia. There is no rash or lesion on the left labia. Right adnexum displays no mass and no tenderness. Left adnexum displays no mass and no tenderness. There is erythema (very tender with speculum partially inserted to allow visualization of vaginal canal. Erythema of outer portion of vaginal canal. No abnormal odors.) and tenderness in the vagina. No foreign body in the vagina. No signs of injury around the vagina. Vaginal discharge (scant white clumpy discharge) found.  Musculoskeletal: Normal range of motion. She exhibits no edema or tenderness.  Lymphadenopathy:    She has no cervical adenopathy.  Neurological: She is alert. She has normal reflexes. She displays normal reflexes. She exhibits normal muscle tone. Coordination normal.  Skin:  Skin is warm. No rash noted. She is not diaphoretic. No erythema.  Psychiatric: She has a normal mood and affect.  Vitals reviewed.  Labs: Upreg negative, Hb 14.1 UA: no glucose or blood, trace prot, trace ketones  Assessment/Plan: 22yr old female with hx of dysmenorrhea who is here for insomnia, intermittent dizziness and headaches, vaginitis, and to discuss periods. PE remarkable for vaginal/labia erythema, discharge, and tenderness as mentioned below. BP and HR normal.  1. Insomnia, unspecified type- 3 months of difficulties falling asleep with uncertain etiology. Several aspects of  sleep hygiene (ie phone use) may be contributing, but not entire cause. She denies anxiety component and GAD-7 is 4, but her complaint that her "brain won't stop" sounds more like anxiety and she is moving to [place] next month. Could also be due to thyroid abnormality. -reviewed sleep hygiene, discussed ways to calm her thoughts at night, and gave handout on sleep hygiene -recommended to either stop OTC med or take in the AM -limit naps -melatonin 3mg  (take 3mg  or 6mg  nightly); if it doesn't work in a week, call us and we could send in a short course of hydroxyzine  2. Dizziness and headaches- Possible etiologies considered include poor diet, anemia, lack of sleep, orthostatic hypotension, endocrine cause (thyroid, prolactin). Orthostatics today are normal, but cannot completely exclude orthostatic hypotension as cause. No other cardiac symptoms to require EKG today. No anemia with POCT Hb 14.4. Normal neurologic exam with no focal deficits or associated symptoms to suggest acute neurologic cause for these symptoms. Her weight is 156lbs, down from 168 in Jan, but same as 05/18/2016 154lbs and several weights before. Denies trying to lose weight or irregular eating habits, so unlikely to be cause of her headaches.  Will get basic labs to ensure no electrolyte abnormalities, blood dyscrasias, or hormone imbalance. -encouraged adequate hydration and proper nutrition -TSH/T4 - CBC With Differential - Ferritin - Basic Metabolic Panel (BMET) - Magnesium - Phosphorus -Prolactin  3. Acute vaginitis- Scant white discharge with significant erythema and tenderness on pelvic exam on inner labia minora and vaginal canal (outerportion). Since she is not sexually active, and with significant itching component, likely yeast or mild irritation. Cannot rule out BV. No significant external irritation to need steroids. -discouraged soaps/chemicals in vagina -diflucan 150mg  x 1 -wet prep -call if not  improving -unable to do pap today due to her discomfort  5. Dysmenorrhea- Heavy and painful periods returned after nexplanon removal. Would like to restart OCPs. -Loestrin Fe 1.5/30  6. Screening for genitourinary condition- unremarkable - POCT urinalysis dipstick  7. Pregnancy examination or test, negative result - POCT urine pregnancy  8. Routine screening for STI (sexually transmitted infection) - C. trachomatis/N. gonorrhoeae RNA  9. Screening for iron deficiency anemia- hemoglobin in normal range 14.4, though decreased from prior visit 15.9  BH screenings: PHQ-SADS reviewed. ZOX09- 10. GAD7 4  Screens discussed with patient and parent and adjustments to plan made accordingly. When discussing screens with patient, she says that all reported symptoms are due to her poor sleep and lack of sleep.  Follow-up:  In one month or sooner if needed based on labs. Consider pap next time when vaginitis symptoms improve.   Annell Greening, MD, MS [facility] Primary Care Pediatrics PGY3

## 2017-09-22 NOTE — Patient Instructions (Signed)
Take melatonin 3mg  nightly (increase to 6mg  if needed). Take 30minutes before you are trying to go to sleep. You can continue neuriva if you want to, but try taking in the morning instead.  Start Loestrin OCP again and take daily.   Teens need about 9 hours of sleep a night. Younger children need more sleep (10-11 hours a night) and adults need slightly less (7-9 hours each night). 11 Tips to Follow: 1. No caffeine after 3pm: Avoid beverages with caffeine (soda, tea, energy drinks, etc.) especially after 3pm.  2. Don't go to bed hungry: Have your evening meal at least 3 hrs. before going to sleep. It's fine to have a small bedtime snack such as a glass of milk and a few crackers but don't have a big meal.  3. Have a nightly routine before bed: Plan on "winding down" before you go to sleep. Begin relaxing about 1 hour before you go to bed. Try doing a quiet activity such as listening to calming music, reading a book or meditating.  4. Turn off the TV and ALL electronics including video games, tablets, laptops, etc. 1 hour before sleep, and keep them out of the bedroom.  5. Turn off your cell phone and all notifications (new email and text alerts) or even better, leave your phone outside your room while you sleep. Studies have shown that a part of your brain continues to respond to certain lights and sounds even while you're still asleep.  6. Make your bedroom quiet, dark and cool. If you can't control the noise, try wearing earplugs or using a fan to block out other sounds.  7. Practice relaxation techniques. Try reading a book or meditating or drain your brain by writing a list of what you need to do the next day.  8. Don't nap unless you feel sick: you'll have a better night's sleep.  9. Don't smoke, or quit if you do. Nicotine, alcohol, and marijuana can all keep you awake. Talk to your health care provider if you need help with substance use.  10. Most importantly, wake up at the same  time every day (or within 1 hour of your usual wake up time) EVEN on the weekends. A regular wake up time promotes sleep hygiene and prevents sleep problems.  11. Reduce exposure to bright light in the last three hours of the day before going to sleep.  Maintaining good sleep hygiene and having good sleep habits lower your risk of developing sleep problems. Getting better sleep can also improve your concentration and alertness. Try the simple steps in this guide. If you still have trouble getting enough rest, make an appointment with your health care provider.

## 2017-09-23 ENCOUNTER — Other Ambulatory Visit: Payer: Self-pay | Admitting: Family

## 2017-09-23 DIAGNOSIS — N760 Acute vaginitis: Secondary | ICD-10-CM

## 2017-09-23 LAB — C. TRACHOMATIS/N. GONORRHOEAE RNA
C. trachomatis RNA, TMA: NOT DETECTED
N. GONORRHOEAE RNA, TMA: NOT DETECTED

## 2017-09-23 LAB — WET PREP BY MOLECULAR PROBE
Candida species: DETECTED — AB
GARDNERELLA VAGINALIS: NOT DETECTED
MICRO NUMBER:: 90881632
SPECIMEN QUALITY:: ADEQUATE
TRICHOMONAS VAG: NOT DETECTED

## 2017-09-23 MED ORDER — FLUCONAZOLE 150 MG PO TABS: 150.0000 mg | ORAL_TABLET | Freq: Every day | ORAL | 0 refills | Status: DC

## 2017-09-23 MED ORDER — NORETHIN ACE-ETH ESTRAD-FE 1.5-30 MG-MCG PO TABS: 1.0000 | ORAL_TABLET | Freq: Every day | ORAL | 11 refills | Status: DC

## 2017-09-24 LAB — BASIC METABOLIC PANEL
BUN: 7 mg/dL (ref 7–25)
CHLORIDE: 105 mmol/L (ref 98–110)
CO2: 23 mmol/L (ref 20–32)
CREATININE: 0.91 mg/dL (ref 0.50–1.10)
Calcium: 9.6 mg/dL (ref 8.6–10.2)
Glucose, Bld: 88 mg/dL (ref 65–99)
POTASSIUM: 4.8 mmol/L (ref 3.5–5.3)
SODIUM: 139 mmol/L (ref 135–146)

## 2017-09-24 LAB — TSH+FREE T4: TSH W/REFLEX TO FT4: 2.44 mIU/L

## 2017-09-24 LAB — MAGNESIUM: MAGNESIUM: 2.1 mg/dL (ref 1.5–2.5)

## 2017-09-24 LAB — FERRITIN: FERRITIN: 56 ng/mL (ref 16–154)

## 2017-09-24 LAB — PHOSPHORUS: Phosphorus: 3.6 mg/dL (ref 2.5–4.5)

## 2017-09-24 LAB — PROLACTIN: PROLACTIN: 6.3 ng/mL

## 2017-10-03 ENCOUNTER — Ambulatory Visit (INDEPENDENT_AMBULATORY_CARE_PROVIDER_SITE_OTHER): Payer: Medicaid Other | Admitting: Pediatrics

## 2017-10-03 ENCOUNTER — Encounter: Payer: Self-pay | Admitting: Pediatrics

## 2017-10-03 ENCOUNTER — Telehealth: Payer: Self-pay

## 2017-10-03 VITALS — BP 113/73 | HR 77 | Ht 63.78 in | Wt 161.0 lb

## 2017-10-03 DIAGNOSIS — G4700 Insomnia, unspecified: Secondary | ICD-10-CM

## 2017-10-03 DIAGNOSIS — N921 Excessive and frequent menstruation with irregular cycle: Secondary | ICD-10-CM | POA: Diagnosis not present

## 2017-10-03 DIAGNOSIS — N946 Dysmenorrhea, unspecified: Secondary | ICD-10-CM

## 2017-10-03 MED ORDER — LEVONORGESTREL-ETHINYL ESTRAD 0.15-30 MG-MCG PO TABS: 1.0000 | ORAL_TABLET | Freq: Every day | ORAL | 4 refills | Status: DC

## 2017-10-03 MED ORDER — ONDANSETRON HCL 4 MG PO TABS: 4.0000 mg | ORAL_TABLET | Freq: Three times a day (TID) | ORAL | 0 refills | Status: DC | PRN

## 2017-10-03 NOTE — Progress Notes (Signed)
History was provided by the patient.  Nicole Ochoa is a 22 y.o. female who is here for vaginal yeast infection, issues with OCP.   PCP confirmed? Yes.    [name], [name], MD  HPI:  Having difficulty with BTB with OCP. She has still been bleeding since we saw her last. She would like to change OCP because she is also having cramping with the bleeding which is frustrating.   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   Insomnia has improved with melatonin.   Review of Systems  Constitutional: Negative for malaise/fatigue.  Eyes: Negative for double vision.  Respiratory: Negative for shortness of breath.   Cardiovascular: Negative for chest pain and palpitations.  Gastrointestinal: Negative for abdominal pain, constipation, diarrhea, nausea and vomiting.  Genitourinary: Negative for dysuria.  Musculoskeletal: Negative for joint pain and myalgias.  Skin: Negative for rash.  Neurological: Negative for dizziness and headaches.  Endo/Heme/Allergies: Does not bruise/bleed easily.     Patient Active Problem List   Diagnosis Date Noted  . Insomnia 09/22/2017  . Dizziness 09/22/2017  . Frequent headaches 09/22/2017  . Dysmenorrhea 03/21/2017  . Abdominal pain, unspecified site 06/29/2013  . Unspecified constipation 06/29/2013    Current Outpatient Medications on File Prior to Visit  Medication Sig Dispense Refill  . norethindrone-ethinyl estradiol-iron (LOESTRIN FE 1.5/30) 1.5-30 MG-MCG tablet Take 1 tablet by mouth daily. 1 Package 11   No current facility-administered medications on file prior to visit.     No Known Allergies  Physical Exam:    Vitals:   10/03/17 0947  BP: 113/73  Pulse: 77  Weight: 161 lb (73 kg)  Height: 5' 3.78" (1.62 m)    Growth percentile SmartLinks can only be used for patients less than 22 years old. No LMP recorded.  Physical Exam  Constitutional: She is oriented to person, place, and time. She appears well-developed and  well-nourished.  HENT:  Head: Normocephalic.  Neck: No thyromegaly present.  Cardiovascular: Normal rate, regular rhythm, normal heart sounds and intact distal pulses.  Pulmonary/Chest: Effort normal and breath sounds normal.  Abdominal: Soft. Bowel sounds are normal. There is no tenderness.  Musculoskeletal: Normal range of motion.  Neurological: She is alert and oriented to person, place, and time.  Skin: Skin is warm and dry.  Psychiatric: She has a normal mood and affect.     Assessment/Plan: 1. Dysmenorrhea Will change OCP to levonorgestrel to see if we can get better control of bleeding and cramping. Will do OCP taper- 3 for 3 days, 2 for 2 days and then start new OCP.   2. Menorrhagia with irregular cycle As above. Yeast infection has cleared per report. She is due for a PAP but given ongoing bleeding and pain today, will wait until next visit.   3. Insomnia, unspecified type Improved with melatonin.

## 2017-10-03 NOTE — Patient Instructions (Addendum)
We will do a birth control pill taper to get your bleeding to stop.   Please take 3 pills for 3 days, 2 pills for 2 days (can extend up to 5 if still bleeding), and then start new pack of pills I have sent in. I have also sent zofran in case you have nausea.   We signed you up for mychart today. Please let me know if you are still having bleeding or concerns when [place]. Once you get established there, please let us know where your closest pharmacy is and we will send your refills there.   You are due for a pap smear, but we won't complete this today since you are having so much bleeding and pain. We will do this at your next visit when you return.

## 2017-10-03 NOTE — Telephone Encounter (Signed)
Pt called stating provider does not accept medicaid. Called pharmacy. Per pharm-they have it ready for pick up. Called patient and made her aware.

## 2017-10-17 ENCOUNTER — Ambulatory Visit: Payer: Self-pay | Admitting: Pediatrics

## 2017-10-20 ENCOUNTER — Ambulatory Visit: Payer: Medicaid Other | Admitting: Pediatrics

## 2017-10-27 ENCOUNTER — Ambulatory Visit: Payer: Self-pay | Admitting: Pediatrics

## 2018-04-13 ENCOUNTER — Ambulatory Visit (INDEPENDENT_AMBULATORY_CARE_PROVIDER_SITE_OTHER): Payer: Medicaid Other | Admitting: Pediatrics

## 2018-04-13 ENCOUNTER — Encounter: Payer: Self-pay | Admitting: Pediatrics

## 2018-04-13 VITALS — BP 97/62 | HR 84 | Ht 63.39 in | Wt 163.0 lb

## 2018-04-13 DIAGNOSIS — N946 Dysmenorrhea, unspecified: Secondary | ICD-10-CM

## 2018-04-13 DIAGNOSIS — N898 Other specified noninflammatory disorders of vagina: Secondary | ICD-10-CM

## 2018-04-13 MED ORDER — FLUCONAZOLE 150 MG PO TABS: ORAL_TABLET | ORAL | 0 refills | Status: DC

## 2018-04-13 MED ORDER — LEVONORGESTREL-ETHINYL ESTRAD 0.15-30 MG-MCG PO TABS: 1.0000 | ORAL_TABLET | Freq: Every day | ORAL | 4 refills | Status: DC

## 2018-04-13 MED ORDER — NAPROXEN 500 MG PO TABS: 500.0000 mg | ORAL_TABLET | Freq: Two times a day (BID) | ORAL | 0 refills | Status: DC

## 2018-04-13 NOTE — Patient Instructions (Signed)
Restart birth control pills today  Start taking naproxen twice daily with food as needed for cramping

## 2018-04-13 NOTE — Progress Notes (Signed)
History was provided by the patient.  Nicole Ochoa is a 23 y.o. female who is here for pap, vaginal discharge, dysmenorrhea.  [name], [name], MD   HPI:    LMP 1/5. Never been sexually active.  Stools once a day and easy to get out.  Hasn't taken any medication for cramping. Would like to restart OCP.  Having some ongoing vaginal itching that did stop after she was treated with diflucan last time.    Patient's last menstrual period was 03/05/2018.  Review of Systems  Constitutional: Negative for malaise/fatigue.  Eyes: Negative for double vision.  Respiratory: Negative for shortness of breath.   Cardiovascular: Negative for chest pain and palpitations.  Gastrointestinal: Negative for abdominal pain, constipation, diarrhea, nausea and vomiting.  Genitourinary: Negative for dysuria.  Musculoskeletal: Negative for joint pain and myalgias.  Skin: Negative for rash.  Neurological: Negative for dizziness and headaches.  Endo/Heme/Allergies: Does not bruise/bleed easily.    Patient Active Problem List   Diagnosis Date Noted  . Insomnia 09/22/2017  . Dizziness 09/22/2017  . Frequent headaches 09/22/2017  . Dysmenorrhea 03/21/2017  . Abdominal pain, unspecified site 06/29/2013  . Unspecified constipation 06/29/2013    No current outpatient medications on file prior to visit.   No current facility-administered medications on file prior to visit.     No Known Allergies    Physical Exam:    Vitals:   04/13/18 0953  BP: 97/62  Pulse: 84  Weight: 163 lb (73.9 kg)  Height: 5' 3.39" (1.61 m)    Growth percentile SmartLinks can only be used for patients less than 23 years old.  Physical Exam Vitals signs and nursing note reviewed. Exam conducted with a chaperone present.  Constitutional:      General: She is not in acute distress.    Appearance: She is well-developed.  Neck:     Thyroid: No thyromegaly.  Cardiovascular:     Rate and Rhythm: Normal rate and regular  rhythm.     Heart sounds: No murmur.  Pulmonary:     Breath sounds: Normal breath sounds.  Abdominal:     Palpations: Abdomen is soft. There is no mass.     Tenderness: There is no abdominal tenderness. There is no guarding.  Genitourinary:    General: Normal vulva.     Pubic Area: No rash.      Labia:        Right: No rash.        Left: No rash.      Vagina: Vaginal discharge present.     Comments: Pt never sexually active; was not able to tolerate spec exam enough for pap to be completed. Wet prep sent. Musculoskeletal:     Right lower leg: No edema.     Left lower leg: No edema.  Lymphadenopathy:     Cervical: No cervical adenopathy.  Skin:    General: Skin is warm.     Findings: No rash.  Neurological:     Mental Status: She is alert.     Comments: No tremor     Assessment/Plan: 1. Dysmenorrhea Restart OCP. Naproxen BID PRN. Will f/u in 6 weeks to observe for improvement.  - levonorgestrel-ethinyl estradiol (NORDETTE) 0.15-30 MG-MCG tablet; Take 1 tablet by mouth daily.  Dispense: 3 Package; Refill: 4 - naproxen (NAPROSYN) 500 MG tablet; Take 1 tablet (500 mg total) by mouth 2 (two) times daily with a meal.  Dispense: 60 tablet; Refill: 0  2. Vaginal itching Wet prep sent.  Was unable to tolerate full exam with spec today.  - WET PREP BY MOLECULAR PROBE

## 2018-04-14 LAB — WET PREP BY MOLECULAR PROBE
Candida species: NOT DETECTED
Gardnerella vaginalis: NOT DETECTED
MICRO NUMBER: 191869
SPECIMEN QUALITY: ADEQUATE
Trichomonas vaginosis: NOT DETECTED

## 2018-05-05 ENCOUNTER — Other Ambulatory Visit: Payer: Self-pay | Admitting: Pediatrics

## 2018-05-05 DIAGNOSIS — N946 Dysmenorrhea, unspecified: Secondary | ICD-10-CM

## 2018-06-01 ENCOUNTER — Other Ambulatory Visit: Payer: Self-pay

## 2018-06-01 ENCOUNTER — Ambulatory Visit (INDEPENDENT_AMBULATORY_CARE_PROVIDER_SITE_OTHER): Payer: Medicaid Other | Admitting: Pediatrics

## 2018-06-01 DIAGNOSIS — R1013 Epigastric pain: Secondary | ICD-10-CM

## 2018-06-01 MED ORDER — SUCRALFATE 1 G PO TABS: 1.0000 g | ORAL_TABLET | Freq: Three times a day (TID) | ORAL | 1 refills | Status: DC

## 2018-06-01 MED ORDER — PANTOPRAZOLE SODIUM 40 MG PO TBEC: 40.0000 mg | DELAYED_RELEASE_TABLET | Freq: Every day | ORAL | 1 refills | Status: DC

## 2018-06-01 NOTE — Progress Notes (Signed)
Virtual Visit via Telephone Note  I connected with Nicole Ochoa 's father  on 06/01/18 at  2:30 PM EDT by telephone and verified that I am speaking with the correct person using two identifiers. Location of patient/parent: at home   I discussed the limitations, risks, security and privacy concerns of performing an evaluation and management service by telephone and the availability of in person appointments. I discussed that the purpose of this phone visit is to provide medical care while limiting exposure to the novel coronavirus.  I also discussed with the patient that there may be a patient responsible charge related to this service. The patient expressed understanding and agreed to proceed.  Reason for visit: Abdoninal pain x 1 week   History of Present Illnests:  Has been having severe pain in stomach x 1 week. Is not on period right now. LMP was 2 weeks ago. She has tried taking pain medication and it did not help. Pain is up high in the middle of her stomach. Last BM was yesterday. It was hard to get out. Before that was 2 days ago and it was easier. Eating makes it better, hunger makes it worse. It is bad in the middle of the night. No sour taste. No burning in throat. She vomited once in the middle of the night. It was food. She has never had anything like this before. No types of food make it worse. No fevers. Jumping up and down does not worsen the pain. Pain 5/10 now. Worst is a 7/10. It does sometimes go away.     Assessment and Plan:  1. Abdominal pain: sounds like GERD. No s/sx of acute abdomen. Not worsening and does go away at times. Worse when hungry. Could have a gastric ulcer given that pain is so severe. Will start with PPI and carafate. If persistent, could get h. Pylori testing if needed or GI referral. I explained to patient and she was in agreement. Will start pantoprazole 40 mg daily and carafate QID PRN.   Follow Up Instructions: Send mychart message on Monday.    I  discussed the assessment and treatment plan with the patient and/or parent/guardian. They were provided an opportunity to ask questions and all were answered. They agreed with the plan and demonstrated an understanding of the instructions.   They were advised to call back or seek an in-person evaluation in the emergency room if the symptoms worsen or if the condition fails to improve as anticipated.  I provided 7 minutes of non-face-to-face time during this encounter. I was located at off site during this encounter.  Alfonso Ramus, FNP

## 2018-06-02 ENCOUNTER — Ambulatory Visit: Payer: Medicaid Other | Admitting: Pediatrics

## 2018-09-06 ENCOUNTER — Ambulatory Visit: Payer: Medicaid Other | Admitting: Family

## 2018-09-06 DIAGNOSIS — Z3041 Encounter for surveillance of contraceptive pills: Secondary | ICD-10-CM

## 2018-09-06 NOTE — Progress Notes (Signed)
I called Nicole Ochoa for her video appointment this afternoon. She is not available to do a video visit at this time and would like to reschedule to an in-person visit next week to discuss refills on [facility] and stomach medicines. She has enough birth control to get her through to next week, per report.   Gasper Sells, MD Pediatrics, PGY-3

## 2018-09-07 NOTE — Progress Notes (Signed)
Closed for administration purposes.

## 2018-09-14 ENCOUNTER — Ambulatory Visit: Payer: Medicaid Other | Admitting: Family

## 2018-10-31 ENCOUNTER — Ambulatory Visit (INDEPENDENT_AMBULATORY_CARE_PROVIDER_SITE_OTHER): Payer: Medicaid Other | Admitting: Family

## 2018-10-31 ENCOUNTER — Encounter: Payer: Self-pay | Admitting: Family

## 2018-10-31 DIAGNOSIS — Z3009 Encounter for other general counseling and advice on contraception: Secondary | ICD-10-CM

## 2018-10-31 DIAGNOSIS — Z3049 Encounter for surveillance of other contraceptives: Secondary | ICD-10-CM | POA: Diagnosis not present

## 2018-10-31 DIAGNOSIS — N898 Other specified noninflammatory disorders of vagina: Secondary | ICD-10-CM | POA: Diagnosis not present

## 2018-10-31 NOTE — Progress Notes (Signed)
Virtual Visit via Telephone Note  I connected with Nicole Ochoa over the phone on 10/31/18 at  9:00 AM EDT and verified that I am speaking with the correct person using two identifiers.   Location of patient/parent: home   I discussed the limitations of evaluation and management by telemedicine and the availability of in person appointments.  I discussed that the purpose of this telehealth visit is to provide medical care while limiting exposure to the novel coronavirus.  The patient expressed understanding and agreed to proceed.  Reason for visit:  Birth control options; vaginal itching  History of Present Illness:   Want to discuss Never sexually active with others before Partner Preference: men Thinking about becoming sexually active Does not want to get pregnant at this point in life  Have just heard about the pill Had tried implant in arm Liked the implant Goal is to prevent pregnancy  No family history of cancer, no personal history of cancer No personal or family history of blood clots  No migraine headache   No use of tobacco products  Last menstrual period: Last month 25th august  Pap smear - not done yet but would like to have done  Vaccines up to date except for flu  Issues with cycle: cramping   Past trial: OCPs (very forgetful); implant (nexplanon)  Second concern today: Vaginal itching "Hurts" when  "Red spots"  Vaginal discharge present - "white, smells too" "liquidy" No fevers Sometimes dysuria Urinary frequency No constipation Sometimes "abdomen is hurting, almost like a cramping but not on my period"    Observations/Objective:  Vitals not taken and physical exam not performed since this was a telephone visit  Assessment and Plan:  Birth control Explored past experience with [facility]. she has tried OCPs and nexplanon previously. -will arrange followup appointment -seems a reasonable candidate for nexplanon/implanon without  contraindications -counseled about consistent use of condoms to prevent STIs and pregnancy -counseled about risks/benefits/contraindications of different birth control options including OCPs, patch, nuvaring, IUDs, implanon/nexplanon, abstinence, condoms, LARCs, depo shot. Patient would like to have arm implant again.  Vaginal itching Has history of this previously in February 2020. Differential includes bacterial vaginosis, candidiasis, herpes, VIN, eczema, lichen sclerosis/planus, among other etiologies. Most likely candidiasis. -followup in clinic this week for physical exam including pelvic exam, wet prep, STI testing, UA, plus other evaluation as clinically indicated  Healthcare Maintenance Needs pap smear Needs influenza vaccine May need HPV vaccines; will verify with patient  Follow Up Instructions:  Follow up in clinic this week for physical exam including pelvic exam. Needs pap smear, flu shot. Needs appt for nexplanon/implanon placement.   I discussed the assessment and treatment plan with the patient and/or parent/guardian. They were provided an opportunity to ask questions and all were answered. They agreed with the plan and demonstrated an understanding of the instructions.   They were advised to call back or seek an in-person evaluation in the emergency room if the symptoms worsen or if the condition fails to improve as anticipated.  I spent 22 minutes on this telehealth visit over the phone and care coordination time I was located at [facility] during this encounter.  Idolina Primer, MD

## 2018-10-31 NOTE — Addendum Note (Signed)
Addended by: Parthenia Ames on: 10/31/2018 01:52 PM   Modules accepted: Orders

## 2018-10-31 NOTE — Patient Instructions (Addendum)
Nicole Ochoa, it was great to chat with you over the phone today.  Let's have you come into clinic for a physical exam. We will schedule you for a pap smear and for a Nexplanon implant insertion.  You'll also need a flu shot this season.  Please do not hesitate to call or send [place] a MyChart message with any questions or concerns.

## 2018-10-31 NOTE — Progress Notes (Signed)
Supervising Provider Co-Signature  I reviewed with the resident the medical history and the resident's findings.  I discussed with the resident the patient's diagnosis and concur with the treatment plan as documented in the resident's note.  Romon Devereux M Amaira Safley, NP  

## 2018-11-16 ENCOUNTER — Other Ambulatory Visit: Payer: Self-pay

## 2018-11-16 ENCOUNTER — Ambulatory Visit (INDEPENDENT_AMBULATORY_CARE_PROVIDER_SITE_OTHER): Payer: Medicaid Other | Admitting: Family

## 2018-11-16 ENCOUNTER — Encounter: Payer: Self-pay | Admitting: Family

## 2018-11-16 VITALS — BP 107/67 | HR 86 | Ht 63.39 in | Wt 161.0 lb

## 2018-11-16 DIAGNOSIS — Z30017 Encounter for initial prescription of implantable subdermal contraceptive: Secondary | ICD-10-CM

## 2018-11-16 DIAGNOSIS — N898 Other specified noninflammatory disorders of vagina: Secondary | ICD-10-CM

## 2018-11-16 DIAGNOSIS — Z3049 Encounter for surveillance of other contraceptives: Secondary | ICD-10-CM

## 2018-11-16 DIAGNOSIS — Z3202 Encounter for pregnancy test, result negative: Secondary | ICD-10-CM | POA: Diagnosis not present

## 2018-11-16 DIAGNOSIS — Z113 Encounter for screening for infections with a predominantly sexual mode of transmission: Secondary | ICD-10-CM | POA: Diagnosis not present

## 2018-11-16 MED ORDER — ETONOGESTREL 68 MG ~~LOC~~ IMPL
68.0000 mg | DRUG_IMPLANT | Freq: Once | SUBCUTANEOUS | Status: AC
  Administered 2018-11-16: 68 mg via SUBCUTANEOUS

## 2018-11-16 NOTE — Patient Instructions (Signed)
Follow-up with me in 1 month. Schedule this appointment before you leave clinic today.  Congratulations on getting your Nexplanon placement!  Below is some important information about Nexplanon.  First remember that Nexplanon does not prevent sexually transmitted infections.  Condoms will help prevent sexually transmitted infections. The Nexplanon starts working 7 days after it was inserted.  There is a risk of getting pregnant if you have unprotected sex in those first 7 days after placement of the Nexplanon.  The Nexplanon lasts for 3 years but can be removed at any time.  You can become pregnant as early as 1 week after removal.  You can have a new Nexplanon put in after the old one is removed if you like.  It is not known whether Nexplanon is as effective in women who are very overweight because the studies did not include many overweight women.  Nexplanon interacts with some medications, including barbiturates, bosentan, carbamazepine, felbamate, griseofulvin, oxcarbazepine, phenytoin, rifampin, St. John's wort, topiramate, HIV medicines.  Please alert your doctor if you are on any of these medicines.  Always tell other healthcare providers that you have a Nexplanon in your arm.  The Nexplanon was placed just under the skin.  Leave the outside bandage on for 24 hours.  Leave the smaller bandage on for 3-5 days or until it falls off on its own.  Keep the area clean and dry for 3-5 days. There is usually bruising or swelling at the insertion site for a few days to a week after placement.  If you see redness or pus draining from the insertion site, call us immediately.  Keep your user card with the date the implant was placed and the date the implant is to be removed.  The most common side effect is a change in your menstrual bleeding pattern.   This bleeding is generally not harmful to you but can be annoying.  Call or come in to see us if you have any concerns about the bleeding or if you  have any side effects or questions.    We will call you in 1 week to check in and we would like you to return to the clinic for a follow-up visit in 1 month.  You can call [facility] 24 hours a day with any questions or concerns.  There is always a nurse or doctor available to take your call.  Call 9-1-1 if you have a life-threatening emergency.  For anything else, please call us at [phone] before heading to the ER. 

## 2018-11-16 NOTE — Progress Notes (Signed)
History was provided by the patient.  Nicole Ochoa is a 23 y.o. female who is here for nexplanon insertion.   PCP confirmed? Yes.    [name], [name], MD  HPI:   -not sexually active; wants nexplanon again  -LMP 8/25  -having some vaginal discharge, white itching -ready for nexplanon insertion, no questions before procedure.    Review of Systems  Constitutional: Negative for chills, fever and malaise/fatigue.  HENT: Negative for sore throat.   Eyes: Negative for blurred vision and double vision.  Respiratory: Negative for shortness of breath.   Cardiovascular: Negative for chest pain and palpitations.  Gastrointestinal: Negative for abdominal pain and nausea.  Genitourinary: Negative for dysuria and urgency.  Musculoskeletal: Negative for myalgias.  Skin: Negative for rash.  Neurological: Negative for dizziness, seizures and headaches.  Psychiatric/Behavioral: Negative for depression. The patient is not nervous/anxious.      Patient Active Problem List   Diagnosis Date Noted  . Insomnia 09/22/2017  . Dizziness 09/22/2017  . Frequent headaches 09/22/2017  . Dysmenorrhea 03/21/2017  . Abdominal pain, unspecified site 06/29/2013  . Unspecified constipation 06/29/2013    Current Outpatient Medications on File Prior to Visit  Medication Sig Dispense Refill  . naproxen (NAPROSYN) 500 MG tablet TAKE 1 TABLET (500 MG TOTAL) BY MOUTH 2 (TWO) TIMES DAILY WITH A MEAL. (Patient not taking: Reported on 11/16/2018) 60 tablet 3  . pantoprazole (PROTONIX) 40 MG tablet Take 1 tablet (40 mg total) by mouth daily. (Patient not taking: Reported on 11/16/2018) 30 tablet 1  . sucralfate (CARAFATE) 1 g tablet Take 1 tablet (1 g total) by mouth 4 (four) times daily -  with meals and at bedtime. (Patient not taking: Reported on 11/16/2018) 120 tablet 1   No current facility-administered medications on file prior to visit.     No Known Allergies  Physical Exam:    Vitals:   11/16/18 1153  BP:  107/67  Pulse: 86  Weight: 161 lb (73 kg)  Height: 5' 3.39" (1.61 m)    Growth percentile SmartLinks can only be used for patients less than 23 years old. Patient's last menstrual period was 10/24/2018.  Physical Exam Vitals signs reviewed. Exam conducted with a chaperone present.  HENT:     Head: Normocephalic.     Mouth/Throat:     Mouth: Mucous membranes are moist.  Eyes:     Extraocular Movements: Extraocular movements intact.     Pupils: Pupils are equal, round, and reactive to light.  Neck:     Musculoskeletal: Normal range of motion.  Cardiovascular:     Rate and Rhythm: Normal rate.     Pulses: Normal pulses.  Pulmonary:     Effort: Pulmonary effort is normal.  Abdominal:     General: Abdomen is flat.  Musculoskeletal: Normal range of motion.        General: No swelling.  Lymphadenopathy:     Cervical: No cervical adenopathy.  Skin:    General: Skin is warm and dry.     Findings: No rash.  Neurological:     General: No focal deficit present.     Mental Status: She is alert.  Psychiatric:        Mood and Affect: Mood normal.     Assessment/Plan: 1. Insertion of Nexplanon See procedure note.  - Subdermal Etonogestrel Implant Insertion - etonogestrel (NEXPLANON) implant 68 mg  2. Vaginal itching -will screen for infections - WET PREP BY MOLECULAR PROBE  3. Negative pregnancy test -negative  -  Pregnancy, urine  4. Routine screening for STI (sexually transmitted infection) -as above - C. trachomatis/N. gonorrhoeae RNA

## 2018-11-17 LAB — WET PREP BY MOLECULAR PROBE
Candida species: NOT DETECTED
Gardnerella vaginalis: NOT DETECTED
MICRO NUMBER:: 893675
SPECIMEN QUALITY:: ADEQUATE
Trichomonas vaginosis: NOT DETECTED

## 2018-11-17 LAB — C. TRACHOMATIS/N. GONORRHOEAE RNA
C. trachomatis RNA, TMA: NOT DETECTED
N. gonorrhoeae RNA, TMA: NOT DETECTED

## 2018-11-21 ENCOUNTER — Encounter: Payer: Self-pay | Admitting: Family

## 2018-11-21 NOTE — Procedures (Signed)
Nexplanon Insertion  No contraindications for placement.  No liver disease, no unexplained vaginal bleeding, no h/o breast cancer, no h/o blood clots.  Patient's last menstrual period was 10/24/2018.  UHCG: negative   Last Unprotected sex:  NA  Risks & benefits of Nexplanon discussed The nexplanon device was purchased and supplied by [facility]. Packaging instructions supplied to patient Consent form signed  The patient denies any allergies to anesthetics or antiseptics.  Procedure: Pt was placed in supine position. The left arm was flexed at the elbow and externally rotated so that her wrist was parallel to her ear The medial epicondyle of the left arm was identified The insertions site was marked 8 cm proximal to the medial epicondyle The insertion site was cleaned with Betadine The area surrounding the insertion site was covered with a sterile drape 1% lidocaine was injected just under the skin at the insertion site extending 4 cm proximally. The sterile preloaded disposable Nexaplanon applicator was removed from the sterile packaging The applicator needle was inserted at a 30 degree angle at 8 cm proximal to the medial epicondyle as marked The applicator was lowered to a horizontal position and advanced just under the skin for the full length of the needle The slider on the applicator was retracted fully while the applicator remained in the same position, then the applicator was removed. The implant was confirmed via palpation as being in position The implant position was demonstrated to the patient Pressure dressing was applied to the patient.  The patient was instructed to removed the pressure dressing in 24 hrs.  The patient was advised to move slowly from a supine to an upright position  The patient denied any concerns or complaints  The patient was instructed to schedule a follow-up appt in 1 month and to call sooner if any concerns.  The patient acknowledged agreement  and understanding of the plan.

## 2018-12-13 ENCOUNTER — Ambulatory Visit: Payer: Medicaid Other | Admitting: Family

## 2018-12-13 DIAGNOSIS — Z975 Presence of (intrauterine) contraceptive device: Secondary | ICD-10-CM

## 2018-12-13 NOTE — Progress Notes (Signed)
Virtual Visit via Video Note  Called four times: three times on mobile number [phone] and sent text message to connect twice; called once at home [phone]. No answer, unfortunately no opportunity to leave a voice mail.   Daisy Floro, DO

## 2018-12-18 ENCOUNTER — Encounter: Payer: Self-pay | Admitting: Family

## 2019-05-09 ENCOUNTER — Telehealth (INDEPENDENT_AMBULATORY_CARE_PROVIDER_SITE_OTHER): Payer: Self-pay | Admitting: Family

## 2019-05-09 ENCOUNTER — Encounter: Payer: Self-pay | Admitting: Family

## 2019-05-09 ENCOUNTER — Other Ambulatory Visit: Payer: Medicaid Other

## 2019-05-09 DIAGNOSIS — Z3049 Encounter for surveillance of other contraceptives: Secondary | ICD-10-CM

## 2019-05-09 DIAGNOSIS — N946 Dysmenorrhea, unspecified: Secondary | ICD-10-CM

## 2019-05-09 DIAGNOSIS — Z975 Presence of (intrauterine) contraceptive device: Secondary | ICD-10-CM

## 2019-05-09 DIAGNOSIS — N921 Excessive and frequent menstruation with irregular cycle: Secondary | ICD-10-CM

## 2019-05-09 NOTE — Progress Notes (Signed)
This note is not being shared with the patient for the following reason: To respect privacy (The patient or proxy has requested that the information not be shared).  THIS RECORD MAY CONTAIN CONFIDENTIAL INFORMATION THAT SHOULD NOT BE RELEASED WITHOUT REVIEW OF THE SERVICE PROVIDER.  Virtual Follow-Up Visit via Video Note  I connected with Nicole Ochoa   on 05/09/19 at  2:30 PM EST by a video enabled telemedicine application and verified that I am speaking with the correct person using two identifiers.   Patient/parent location: home   I discussed the limitations of evaluation and management by telemedicine and the availability of in person appointments.  I discussed that the purpose of this telehealth visit is to provide medical care while limiting exposure to the novel coronavirus.  The patient expressed understanding and agreed to proceed.   Nicole Ochoa is a 24 y.o. female referred by Ok Edwards, MD here today for follow-up of breakthrough bleeding with Nexplanon.    History was provided by the patient.  Plan from Last Visit:   Nexplanon insertion 11/16/2018  Chief Complaint: -breakthrough bleeding with Nexplanon   History of Present Illness:  -bleeding x 3 weeks  -not sexually active in last 60 days -irregular: spotting then heavier  -no cramping,  -no SOB, no palpitations.    Review of Systems  Constitutional: Negative for chills, fever and malaise/fatigue.  HENT: Negative for sore throat.   Respiratory: Negative for cough and shortness of breath.   Cardiovascular: Negative for chest pain and palpitations.  Gastrointestinal: Negative for abdominal pain and nausea.  Genitourinary: Negative for dysuria and urgency.  Neurological: Negative for dizziness and headaches.  Psychiatric/Behavioral: The patient is not nervous/anxious.     No Known Allergies Outpatient Medications Prior to Visit  Medication Sig Dispense Refill  . naproxen (NAPROSYN) 500 MG tablet TAKE 1  TABLET (500 MG TOTAL) BY MOUTH 2 (TWO) TIMES DAILY WITH A MEAL. (Patient not taking: Reported on 11/16/2018) 60 tablet 3  . pantoprazole (PROTONIX) 40 MG tablet Take 1 tablet (40 mg total) by mouth daily. (Patient not taking: Reported on 11/16/2018) 30 tablet 1  . sucralfate (CARAFATE) 1 g tablet Take 1 tablet (1 g total) by mouth 4 (four) times daily -  with meals and at bedtime. (Patient not taking: Reported on 11/16/2018) 120 tablet 1   No facility-administered medications prior to visit.     Patient Active Problem List   Diagnosis Date Noted  . Insomnia 09/22/2017  . Dizziness 09/22/2017  . Frequent headaches 09/22/2017  . Dysmenorrhea 03/21/2017  . Abdominal pain, unspecified site 06/29/2013  . Unspecified constipation 06/29/2013   The following portions of the patient's history were reviewed and updated as appropriate: allergies, current medications, past family history, past medical history, past social history, past surgical history and problem list.  Visual Observations/Objective:   General Appearance: Well nourished well developed, in no apparent distress.  Eyes: conjunctiva no swelling or erythema ENT/Mouth: No hoarseness, No cough for duration of visit.  Neck: Supple  Respiratory: Respiratory effort normal, normal rate, no retractions or distress.   Cardio: Appears well-perfused, noncyanotic Musculoskeletal: no obvious deformity Skin: visible skin without rashes, ecchymosis, erythema Neuro: Awake and oriented X 3,  Psych:  normal affect, Insight and Judgment appropriate.    Assessment/Plan: 24 yo A/I female presents s/p Nexplanon insertion in September 2020 for dysmenorrhea. She is not experiencing cramping or painful intercourse however now has unpredictable bleeding pattern with Nexplanon. Advised that we will schedule her for  RN visit to collect swabs and urine for infection rule/out.    1. Dysmenorrhea 2. Breakthrough bleeding on Nexplanon  - WET PREP BY MOLECULAR  PROBE - Urine cytology ancillary only  I discussed the assessment and treatment plan with the patient and/or parent/guardian.  They were provided an opportunity to ask questions and all were answered.  They agreed with the plan and demonstrated an understanding of the instructions. They were advised to call back or seek an in-person evaluation in the emergency room if the symptoms worsen or if the condition fails to improve as anticipated.   Follow-up:   Pending labs   Medical decision-making:   I spent 15 minutes on this telehealth visit inclusive of face-to-face video and care coordination time I was located remote in [place] during this encounter.   Georges Mouse, NP    CC: Marijo File, MD, Marijo File, MD

## 2019-05-10 ENCOUNTER — Other Ambulatory Visit (HOSPITAL_COMMUNITY)
Admission: RE | Admit: 2019-05-10 | Discharge: 2019-05-10 | Disposition: A | Discharge status: Home / Self Care | Payer: Medicaid Other | Source: Ambulatory Visit | Attending: Family | Admitting: Family

## 2019-05-10 ENCOUNTER — Other Ambulatory Visit: Payer: Medicaid Other

## 2019-05-10 ENCOUNTER — Other Ambulatory Visit: Payer: Self-pay

## 2019-05-10 DIAGNOSIS — Z975 Presence of (intrauterine) contraceptive device: Secondary | ICD-10-CM | POA: Insufficient documentation

## 2019-05-10 DIAGNOSIS — N921 Excessive and frequent menstruation with irregular cycle: Secondary | ICD-10-CM | POA: Diagnosis not present

## 2019-05-11 LAB — URINE CYTOLOGY ANCILLARY ONLY
Chlamydia: NEGATIVE
Comment: NEGATIVE
Comment: NORMAL
Neisseria Gonorrhea: NEGATIVE

## 2019-05-12 LAB — WET PREP BY MOLECULAR PROBE
Candida species: NOT DETECTED
Gardnerella vaginalis: NOT DETECTED
MICRO NUMBER:: 10240735
SPECIMEN QUALITY:: ADEQUATE
Trichomonas vaginosis: NOT DETECTED

## 2019-05-14 NOTE — Progress Notes (Signed)
Patient came in for labs Wet prep and GC urine. Labs ordered by Christy Jones. Successful collection. 

## 2019-05-18 ENCOUNTER — Telehealth: Payer: Self-pay

## 2019-05-18 NOTE — Telephone Encounter (Signed)
Pt would like a call back about her situation and the message she sent on mychart.

## 2019-05-21 ENCOUNTER — Other Ambulatory Visit: Payer: Self-pay | Admitting: Family

## 2019-05-21 ENCOUNTER — Encounter: Payer: Self-pay | Admitting: Family

## 2019-05-21 MED ORDER — NORETHIN ACE-ETH ESTRAD-FE 1-20 MG-MCG PO TABS: 1.0000 | ORAL_TABLET | Freq: Every day | ORAL | 1 refills | Status: DC

## 2019-05-21 NOTE — Telephone Encounter (Signed)
Called number on file, no answer, VM box full. Sent patient MyChart message to follow up.

## 2019-06-05 ENCOUNTER — Encounter: Payer: Self-pay | Admitting: Family

## 2019-06-05 ENCOUNTER — Telehealth (INDEPENDENT_AMBULATORY_CARE_PROVIDER_SITE_OTHER): Payer: Medicaid Other | Admitting: Family

## 2019-06-05 DIAGNOSIS — N898 Other specified noninflammatory disorders of vagina: Secondary | ICD-10-CM | POA: Diagnosis not present

## 2019-06-05 DIAGNOSIS — Z975 Presence of (intrauterine) contraceptive device: Secondary | ICD-10-CM | POA: Diagnosis not present

## 2019-06-05 DIAGNOSIS — N9089 Other specified noninflammatory disorders of vulva and perineum: Secondary | ICD-10-CM

## 2019-06-05 DIAGNOSIS — N921 Excessive and frequent menstruation with irregular cycle: Secondary | ICD-10-CM | POA: Diagnosis not present

## 2019-06-05 NOTE — Progress Notes (Signed)
This note is not being shared with the patient for the following reason: To respect privacy (The patient or proxy has requested that the information not be shared).  THIS RECORD MAY CONTAIN CONFIDENTIAL INFORMATION THAT SHOULD NOT BE RELEASED WITHOUT REVIEW OF THE SERVICE PROVIDER.  Virtual Follow-Up Visit via Video Note  I connected with Nicole Ochoa  on 06/05/19 at  9:00 AM EDT by a video enabled telemedicine application and verified that I am speaking with the correct person using two identifiers.   Patient/parent location: home   I discussed the limitations of evaluation and management by telemedicine and the availability of in person appointments.  I discussed that the purpose of this telehealth visit is to provide medical care while limiting exposure to the novel coronavirus.  The patient expressed understanding and agreed to proceed.   Nicole Ochoa is a 24 y.o. female referred by Marijo File, MD here today for follow-up of breakthrough bleeding with nexplanon.   History was provided by the patient.  Plan from Last Visit:   -Junel 1/20 for breakthrough bleeding with nexplanon  Chief Complaint:  Breakthrough bleeding with nexplanon  Labial rash   History of Present Illness:  -nexplanon x 7 months  -new onset of bleeding x 2 months  -had wet prep and gc/c (negative) on 03/11 -started OCPs for bleeding control and no improvement  -denies thyroid disease in FH or PMH  -no constipation, no skin changes, no temperature intolerances  -08/2517 thyroid studies: TSH 2.44  -notes rash on labial folds; has had this before and used an OTC cream; presentation started about a week ago; no lesions, no dysuria; itching noted.   Review of Systems  Constitutional: Negative for chills, fever and malaise/fatigue.  HENT: Negative for sore throat.   Eyes: Negative for blurred vision, double vision and pain.  Respiratory: Negative for shortness of breath.   Cardiovascular: Negative for chest  pain and palpitations.  Gastrointestinal: Negative for abdominal pain and nausea.  Genitourinary: Negative for dysuria and frequency.  Skin: Negative for rash.  Neurological: Negative for dizziness and headaches.  Psychiatric/Behavioral: The patient is not nervous/anxious.     No Known Allergies Outpatient Medications Prior to Visit  Medication Sig Dispense Refill  . naproxen (NAPROSYN) 500 MG tablet TAKE 1 TABLET (500 MG TOTAL) BY MOUTH 2 (TWO) TIMES DAILY WITH A MEAL. (Patient not taking: Reported on 11/16/2018) 60 tablet 3  . norethindrone-ethinyl estradiol (JUNEL FE 1/20) 1-20 MG-MCG tablet Take 1 tablet by mouth daily. 1 Package 1  . pantoprazole (PROTONIX) 40 MG tablet Take 1 tablet (40 mg total) by mouth daily. (Patient not taking: Reported on 11/16/2018) 30 tablet 1  . sucralfate (CARAFATE) 1 g tablet Take 1 tablet (1 g total) by mouth 4 (four) times daily -  with meals and at bedtime. (Patient not taking: Reported on 11/16/2018) 120 tablet 1   No facility-administered medications prior to visit.     Patient Active Problem List   Diagnosis Date Noted  . Insomnia 09/22/2017  . Dizziness 09/22/2017  . Frequent headaches 09/22/2017  . Dysmenorrhea 03/21/2017  . Abdominal pain, unspecified site 06/29/2013  . Unspecified constipation 06/29/2013    The following portions of the patient's history were reviewed and updated as appropriate: allergies, current medications, past family history, past medical history, past social history, past surgical history and problem list.    Observations/Objective:  Unable to see patient; video not available for her.  ENT/Mouth: No hoarseness, No cough for duration of visit.  Respiratory: Respiratory effort normal, normal rate, no retractions or distress.   Neuro: Awake and oriented X 3,  Psych:  normal affect, Insight and Judgment appropriate.    Assessment/Plan: 1. Labial irritation 2. Vaginal itching 3. Breakthrough bleeding on  Nexplanon  Discussed need for pelvic exam and visual inspection to assess current rash and bleeding. Reviewed with patient that sudden onset of breakthrough bleeding several months after menstrual suppression with implant is not typically side effect. Ruled out infection, however will obtain thyroid labs to rule out other possible etiologies. If labs are normal, consider imaging.    I discussed the assessment and treatment plan with the patient and/or parent/guardian.  They were provided an opportunity to ask questions and all were answered.  They agreed with the plan and demonstrated an understanding of the instructions. They were advised to call back or seek an in-person evaluation in the emergency room if the symptoms worsen or if the condition fails to improve as anticipated.   Follow-up:   In person for further workup; schedule at patient's convenience.   Medical decision-making:   I spent 15 minutes on this telehealth visit inclusive of face-to-face video and care coordination time I was located remote in [place] during this encounter.   Parthenia Ames, NP    CC: Ok Edwards, MD, Ok Edwards, MD

## 2019-06-08 ENCOUNTER — Telehealth: Payer: Self-pay

## 2019-06-08 NOTE — Telephone Encounter (Signed)
Needs refill for birth control, cancelled appt for the 19th cause she is going to be out of town for a month. Please call pt back at earliest convenience.

## 2019-06-11 ENCOUNTER — Other Ambulatory Visit: Payer: Self-pay | Admitting: Pediatrics

## 2019-06-11 MED ORDER — NORETHIN ACE-ETH ESTRAD-FE 1-20 MG-MCG PO TABS: 1.0000 | ORAL_TABLET | Freq: Every day | ORAL | 1 refills | Status: DC

## 2019-06-11 NOTE — Telephone Encounter (Signed)
Called and made patient aware. 

## 2019-06-11 NOTE — Telephone Encounter (Signed)
Done

## 2019-06-18 ENCOUNTER — Ambulatory Visit: Payer: Medicaid Other | Admitting: Pediatrics

## 2020-01-03 ENCOUNTER — Ambulatory Visit (INDEPENDENT_AMBULATORY_CARE_PROVIDER_SITE_OTHER): Payer: Medicaid Other | Admitting: Pediatrics

## 2020-01-03 ENCOUNTER — Encounter: Payer: Self-pay | Admitting: Pediatrics

## 2020-01-03 ENCOUNTER — Other Ambulatory Visit (HOSPITAL_COMMUNITY)
Admission: RE | Admit: 2020-01-03 | Discharge: 2020-01-03 | Disposition: A | Discharge status: Home / Self Care | Payer: Medicaid Other | Source: Ambulatory Visit | Attending: Pediatrics | Admitting: Pediatrics

## 2020-01-03 ENCOUNTER — Other Ambulatory Visit: Payer: Self-pay

## 2020-01-03 VITALS — BP 93/62 | HR 63 | Ht 63.39 in | Wt 155.8 lb

## 2020-01-03 DIAGNOSIS — Z130 Encounter for screening for diseases of the blood and blood-forming organs and certain disorders involving the immune mechanism: Secondary | ICD-10-CM | POA: Diagnosis not present

## 2020-01-03 DIAGNOSIS — N921 Excessive and frequent menstruation with irregular cycle: Secondary | ICD-10-CM | POA: Diagnosis not present

## 2020-01-03 DIAGNOSIS — Z3049 Encounter for surveillance of other contraceptives: Secondary | ICD-10-CM | POA: Diagnosis not present

## 2020-01-03 DIAGNOSIS — Z975 Presence of (intrauterine) contraceptive device: Secondary | ICD-10-CM

## 2020-01-03 DIAGNOSIS — Z113 Encounter for screening for infections with a predominantly sexual mode of transmission: Secondary | ICD-10-CM

## 2020-01-03 LAB — POCT HEMOGLOBIN: Hemoglobin: 13.4 g/dL (ref 11–14.6)

## 2020-01-03 MED ORDER — NORETHINDRONE 0.35 MG PO TABS: 1.0000 | ORAL_TABLET | Freq: Every day | ORAL | 3 refills | Status: DC

## 2020-01-03 NOTE — Patient Instructions (Addendum)
Take pill daily for your bleeding. Consider IUD in the future. We will see you virtually in 4 weeks!   Adult Primary Care Clinics Name Criteria Services   [facility]  Address: [address] [place], [place] [address]  Phone: [phone] Hours: Monday - Friday 9 AM -6 PM  Types of insurance accepted:  Marland Kitchen Nurse, learning disability . [facility] (orange card) . Medicaid . Medicare . Uninsured  Language services:  Marland Kitchen Video and phone interpreters available   Ages 22 and older    . Adult primary care . Onsite pharmacy . Integrated behavioral health . Financial assistance counseling . Walk-in hours for established patients  Financial assistance counseling hours: Tuesdays 2:00PM - 5:00PM  Thursday 8:30AM - 4:30PM  Space is limited, 10 on Tuesday and 20 on Thursday. It's on first come first serve basis  Name Criteria Services   [facility]  Address: [address] [place], [place] [address]  Phone: [phone]  Hours: Monday - Friday 8:30 AM - 5 PM  Types of insurance accepted:  Marland Kitchen Nurse, learning disability . Medicaid . Medicare . Uninsured  Language services:  Marland Kitchen Video and phone interpreters available   All ages - newborn to adult   . Primary care for all ages (children and adults) . Integrated behavioral health . Nutritionist . Financial assistance counseling   Name Criteria Services   [facility]  Located on the ground floor of [facility]  Address: [address]  [place],  [place]  [address]  Phone: [phone]  Hours: Monday - Friday 8:15 AM - 5 PM  Types of insurance accepted:  Marland Kitchen Nurse, learning disability . Medicaid . Medicare . Uninsured  Language services:  Marland Kitchen Video and phone interpreters available   Ages 53 and older   . Adult primary care . Nutritionist . Certified Diabetes Educator  . Integrated behavioral health . Financial  assistance counseling   Name Criteria Services   [facility]  Address: [address] [place], [place] [address]  Phone: [phone]  Hours: Monday - Friday 8:30 AM - 5 PM    Types of insurance accepted:  Marland Kitchen Nurse, learning disability . Medicaid . Medicare . Uninsured  Language services:  Marland Kitchen Video and phone interpreters available   All ages - newborn to adult   . Primary care for all ages (children and adults) . Integrated behavioral health . Financial assistance counseling

## 2020-01-03 NOTE — Progress Notes (Signed)
History was provided by the patient.  Nicole Ochoa is a 24 y.o. female who is here for vaginal bleeding.   PCP confirmed? Yes.   No PCP.   HPI:    Patient started with menstrual bleeding three weeks ago, started out heavy and now light. Currently going through 3 pads a day. A few blood clots. No abdominal pain. No vaginal discharge. No dysuria. No fevers. No vaginal discharge or vaginal itching. No other abnormal bleeding or bruising. Currently has Nexplanon in place, placed about a year ago, has had episodes of breakthrough bleeding since it was placed usually about every 2 months for one week but have not usually lasted this long.   No longer taking Junel because it was making her tired, stopped about 2 months ago.   Never sexually active, no hx of STI's. No known family hx of fibroid. Sophomore in college, studying to be a Engineer, site.   Nexplanon placed 10/2018.   Review of Systems  Constitutional: Negative for fever.  HENT: Negative for congestion.   Respiratory: Negative for cough.   Cardiovascular: Negative for chest pain.  Gastrointestinal: Negative for abdominal pain and nausea.  Genitourinary: Negative for dysuria and hematuria.  Skin: Negative for rash.  Endo/Heme/Allergies: Does not bruise/bleed easily.     Patient Active Problem List   Diagnosis Date Noted  . Insomnia 09/22/2017  . Dizziness 09/22/2017  . Frequent headaches 09/22/2017  . Dysmenorrhea 03/21/2017  . Abdominal pain, unspecified site 06/29/2013  . Unspecified constipation 06/29/2013    No current outpatient medications on file prior to visit.   No current facility-administered medications on file prior to visit.    No Known Allergies  Physical Exam:    Vitals:   01/03/20 0919  BP: 93/62  Pulse: 63  Weight: 155 lb 12.8 oz (70.7 kg)  Height: 5' 3.39" (1.61 m)    Growth percentile SmartLinks can only be used for patients less than 43 years old. No LMP recorded.  Physical  Exam Constitutional:      General: She is not in acute distress.    Appearance: She is not diaphoretic.  HENT:     Head: Normocephalic and atraumatic.     Mouth/Throat:     Comments: Facial mask in place.  Eyes:     Conjunctiva/sclera: Conjunctivae normal.  Cardiovascular:     Rate and Rhythm: Normal rate and regular rhythm.     Heart sounds: Normal heart sounds. No murmur heard.   Pulmonary:     Effort: Pulmonary effort is normal.     Breath sounds: Normal breath sounds. No wheezing.  Abdominal:     General: Abdomen is flat. There is no distension.     Palpations: Abdomen is soft.     Tenderness: There is no abdominal tenderness. There is no guarding or rebound.  Musculoskeletal:     Cervical back: Normal range of motion.     Right lower leg: No edema.     Left lower leg: No edema.  Skin:    General: Skin is warm and dry.     Findings: No rash.  Neurological:     General: No focal deficit present.     Mental Status: She is alert and oriented to person, place, and time.  Psychiatric:        Mood and Affect: Mood normal.        Thought Content: Thought content normal.      Assessment/Plan: This is a 24 year old female with  history of dysmenorrhea and menorrhagia who presents for breakthrough bleeding on Nexplanon.   Routine screening for STI (sexually transmitted infection). Not sexually active and prior testing normal but with retest today in setting of breakthrough bleeding.  Plan: Urine cytology ancillary only  Screening for iron deficiency anemia -  Plan: POCT hemoglobin normal.   Breakthrough bleeding on Nexplanon - History of breakthrough bleeding on Nexplanon in the past. No abdominal pain or fevers. Normal physical exam. Stopped Junel two months ago due to fatigue which may be contributing. Possible the estrogen component was contributing to her fatigue. Prior workup for menorrhagia normal, and Hgb today wnl. Discussed starting progesterone only continuous OCP  with her today vs planning for IUD placement. She wishes to continue with Nexplanon for now with OCP and will consider IUD in future.  Plan:  - norethindrone (CAMILA) 0.35 MG tablet - Continue Nexplanon.  - Consider IUD in future - Gave list of PCP's as transitioning to adult care - Follow up virtually in 4 weeks

## 2020-01-05 NOTE — Progress Notes (Signed)
I have reviewed the resident's note and plan of care and helped develop the plan as necessary.  Will continue to support to transition to adult care.   Alfonso Ramus, FNP

## 2020-01-08 LAB — URINE CYTOLOGY ANCILLARY ONLY
Bacterial Vaginitis-Urine: NEGATIVE
Candida Urine: NEGATIVE
Chlamydia: NEGATIVE
Comment: NEGATIVE
Comment: NEGATIVE
Comment: NORMAL
Neisseria Gonorrhea: NEGATIVE
Trichomonas: NEGATIVE

## 2020-01-12 ENCOUNTER — Ambulatory Visit: Payer: Medicaid Other

## 2020-01-31 ENCOUNTER — Telehealth: Payer: Medicaid Other | Admitting: Pediatrics

## 2020-05-03 ENCOUNTER — Other Ambulatory Visit: Payer: Self-pay

## 2020-05-03 ENCOUNTER — Emergency Department (HOSPITAL_COMMUNITY)
Admission: EM | Admit: 2020-05-03 | Discharge: 2020-05-03 | Disposition: A | Discharge status: Home / Self Care | Payer: Medicaid Other | Attending: Emergency Medicine | Admitting: Emergency Medicine

## 2020-05-03 ENCOUNTER — Emergency Department (HOSPITAL_COMMUNITY): Payer: Medicaid Other

## 2020-05-03 DIAGNOSIS — M25512 Pain in left shoulder: Secondary | ICD-10-CM | POA: Insufficient documentation

## 2020-05-03 DIAGNOSIS — M25561 Pain in right knee: Secondary | ICD-10-CM | POA: Insufficient documentation

## 2020-05-03 DIAGNOSIS — Z23 Encounter for immunization: Secondary | ICD-10-CM | POA: Diagnosis not present

## 2020-05-03 DIAGNOSIS — S60512A Abrasion of left hand, initial encounter: Secondary | ICD-10-CM | POA: Insufficient documentation

## 2020-05-03 DIAGNOSIS — S6992XA Unspecified injury of left wrist, hand and finger(s), initial encounter: Secondary | ICD-10-CM | POA: Diagnosis present

## 2020-05-03 DIAGNOSIS — Y92410 Unspecified street and highway as the place of occurrence of the external cause: Secondary | ICD-10-CM | POA: Diagnosis not present

## 2020-05-03 MED ORDER — IBUPROFEN 800 MG PO TABS: 800.0000 mg | ORAL_TABLET | Freq: Three times a day (TID) | ORAL | 0 refills | Status: DC

## 2020-05-03 MED ORDER — KETOROLAC TROMETHAMINE 60 MG/2ML IM SOLN: 60.0000 mg | Freq: Once | INTRAMUSCULAR | Status: DC

## 2020-05-03 MED ORDER — FENTANYL CITRATE (PF) 100 MCG/2ML IJ SOLN
50.0000 ug | Freq: Once | INTRAMUSCULAR | Status: AC
  Administered 2020-05-03: 50 ug via INTRAMUSCULAR
  Filled 2020-05-03: qty 2

## 2020-05-03 MED ORDER — METHOCARBAMOL 500 MG PO TABS: 500.0000 mg | ORAL_TABLET | Freq: Two times a day (BID) | ORAL | 0 refills | Status: DC

## 2020-05-03 MED ORDER — TETANUS-DIPHTH-ACELL PERTUSSIS 5-2.5-18.5 LF-MCG/0.5 IM SUSY
0.5000 mL | PREFILLED_SYRINGE | Freq: Once | INTRAMUSCULAR | Status: AC
  Administered 2020-05-03: 0.5 mL via INTRAMUSCULAR
  Filled 2020-05-03: qty 0.5
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
## 2020-05-03 NOTE — ED Notes (Signed)
Patient was able to ambulate without assistance, she said her right leg hurts when she walks.

## 2020-05-03 NOTE — Discharge Instructions (Addendum)
Take NSAIDs (ibuprofen, you may take up to 800 mg of ibuprofen up to 3 times daily for a week) or Tylenol as needed for the next week. Take this medicine with food. Take muscle relaxer at bedtime to help you sleep. This medicine makes you drowsy so do not take before driving or work Please wear the sling until you are evaluated by an orthopedic doctor.  I provided the contact information for Dr. Salli Real in your discharge paperwork.  Please call the phone number to schedule an appointment.  You may also follow-up with your primary care doctor as well. Use a heating pad or ice for sore muscles - use for 20 minutes several times a day Try gentle range of motion exercises Return for worsening symptoms

## 2020-11-10 ENCOUNTER — Other Ambulatory Visit: Payer: Self-pay

## 2020-11-10 ENCOUNTER — Encounter: Payer: Self-pay | Admitting: Nurse Practitioner

## 2020-11-10 ENCOUNTER — Ambulatory Visit (INDEPENDENT_AMBULATORY_CARE_PROVIDER_SITE_OTHER): Payer: Self-pay | Admitting: Nurse Practitioner

## 2020-11-10 VITALS — BP 111/63 | HR 86 | Temp 98.2°F | Ht 63.0 in | Wt 169.5 lb

## 2020-11-10 DIAGNOSIS — R519 Headache, unspecified: Secondary | ICD-10-CM

## 2020-11-10 MED ORDER — PROPRANOLOL HCL 10 MG PO TABS: 10.0000 mg | ORAL_TABLET | Freq: Three times a day (TID) | ORAL | 5 refills | Status: DC

## 2020-11-10 MED ORDER — NURTEC 75 MG PO TBDP: 75.0000 mg | ORAL_TABLET | Freq: Once | ORAL | 0 refills | Status: AC

## 2020-11-10 NOTE — Assessment & Plan Note (Signed)
Patient is reporting severe headache in the past 4 to 5 months that lasts 2 hours with nausea, blurred vision and sensitivity to light and ER teams.  Ibuprofen has not helped.  After assessment patient is showing signs and symptoms of migraine. Education provided to patient with printed handouts given. Started patient on propranolol 10 mg tablet by mouth daily.  Samples of Nurtec given to patient for abortive treatment.  Patient verbalized understanding.  Follow-up with unresolved or worsening symptoms.

## 2020-11-10 NOTE — Patient Instructions (Signed)
Migraine Headache A migraine headache is an intense, throbbing pain on one side or both sides of the head. Migraine headaches may also cause other symptoms, such as nausea, vomiting, and sensitivity to light and noise. A migraine headache can last from 4 hours to 3 days. Talk with your doctor about what things may bring on (trigger) your migraine headaches. What are the causes? The exact cause of this condition is not known. However, a migraine may be caused when nerves in the brain become irritated and release chemicals that cause inflammation of blood vessels. This inflammation causes pain. This condition may be triggered or caused by: Drinking alcohol. Smoking. Taking medicines, such as: Medicine used to treat chest pain (nitroglycerin). Birth control pills. Estrogen. Certain blood pressure medicines. Eating or drinking products that contain nitrates, glutamate, aspartame, or tyramine. Aged cheeses, chocolate, or caffeine may also be triggers. Doing physical activity. Other things that may trigger a migraine headache include: Menstruation. Pregnancy. Hunger. Stress. Lack of sleep or too much sleep. Weather changes. Fatigue. What increases the risk? The following factors may make you more likely to experience migraine headaches: Being a certain age. This condition is more common in people who are 25-55 years old. Being female. Having a family history of migraine headaches. Being Caucasian. Having a mental health condition, such as depression or anxiety. Being obese. What are the signs or symptoms? The main symptom of this condition is pulsating or throbbing pain. This pain may: Happen in any area of the head, such as on one side or both sides. Interfere with daily activities. Get worse with physical activity. Get worse with exposure to bright lights or loud noises. Other symptoms may include: Nausea. Vomiting. Dizziness. General sensitivity to bright lights, loud noises, or  smells. Before you get a migraine headache, you may get warning signs (an aura). An aura may include: Seeing flashing lights or having blind spots. Seeing bright spots, halos, or zigzag lines. Having tunnel vision or blurred vision. Having numbness or a tingling feeling. Having trouble talking. Having muscle weakness. Some people have symptoms after a migraine headache (postdromal phase), such as: Feeling tired. Difficulty concentrating. How is this diagnosed? A migraine headache can be diagnosed based on: Your symptoms. A physical exam. Tests, such as: CT scan or an MRI of the head. These imaging tests can help rule out other causes of headaches. Taking fluid from the spine (lumbar puncture) and analyzing it (cerebrospinal fluid analysis, or CSF analysis). How is this treated? This condition may be treated with medicines that: Relieve pain. Relieve nausea. Prevent migraine headaches. Treatment for this condition may also include: Acupuncture. Lifestyle changes like avoiding foods that trigger migraine headaches. Biofeedback. Cognitive behavioral therapy. Follow these instructions at home: Medicines Take over-the-counter and prescription medicines only as told by your health care provider. Ask your health care provider if the medicine prescribed to you: Requires you to avoid driving or using heavy machinery. Can cause constipation. You may need to take these actions to prevent or treat constipation: Drink enough fluid to keep your urine pale yellow. Take over-the-counter or prescription medicines. Eat foods that are high in fiber, such as beans, whole grains, and fresh fruits and vegetables. Limit foods that are high in fat and processed sugars, such as fried or sweet foods. Lifestyle Do not drink alcohol. Do not use any products that contain nicotine or tobacco, such as cigarettes, e-cigarettes, and chewing tobacco. If you need help quitting, ask your health care  provider. Get at least 8   hours of sleep every night. Find ways to manage stress, such as meditation, deep breathing, or yoga. General instructions   Keep a journal to find out what may trigger your migraine headaches. For example, write down: What you eat and drink. How much sleep you get. Any change to your diet or medicines. If you have a migraine headache: Avoid things that make your symptoms worse, such as bright lights. It may help to lie down in a dark, quiet room. Do not drive or use heavy machinery. Ask your health care provider what activities are safe for you while you are experiencing symptoms. Keep all follow-up visits as told by your health care provider. This is important. Contact a health care provider if: You develop symptoms that are different or more severe than your usual migraine headache symptoms. You have more than 15 headache days in one month. Get help right away if: Your migraine headache becomes severe. Your migraine headache lasts longer than 72 hours. You have a fever. You have a stiff neck. You have vision loss. Your muscles feel weak or like you cannot control them. You start to lose your balance often. You have trouble walking. You faint. You have a seizure. Summary A migraine headache is an intense, throbbing pain on one side or both sides of the head. Migraines may also cause other symptoms, such as nausea, vomiting, and sensitivity to light and noise. This condition may be treated with medicines and lifestyle changes. You may also need to avoid certain things that trigger a migraine headache. Keep a journal to find out what may trigger your migraine headaches. Contact your health care provider if you have more than 15 headache days in a month or you develop symptoms that are different or more severe than your usual migraine headache symptoms. This information is not intended to replace advice given to you by your health care provider. Make sure you  discuss any questions you have with your health care provider. Document Revised: 06/09/2018 Document Reviewed: 03/30/2018 Elsevier Patient Education  2022 [employer] Inc[employer]  

## 2020-11-10 NOTE — Progress Notes (Signed)
Acute Office Visit  Subjective:    Patient ID: Nicole Ochoa, female    DOB: May 04, 1995, 25 y.o.   MRN: [id number]  Chief Complaint  Patient presents with  . New Patient (Initial Visit)    Headache  This is a recurrent problem. The current episode started more than 1 month ago. The problem occurs constantly. The problem has been gradually worsening. The pain is located in the Bilateral region. The pain does not radiate. The pain quality is similar to prior headaches. The quality of the pain is described as aching and sharp. The pain is at a severity of 8/10. The pain is severe. Associated symptoms include blurred vision, nausea and photophobia. Nothing aggravates the symptoms. She has tried NSAIDs for the symptoms. The treatment provided no relief.  Past Medical History:  Diagnosis Date  . Abdominal pain    ER visit  . Episode of heavy vaginal bleeding     History reviewed. No pertinent surgical history.  Family History  Problem Relation Age of Onset  . Clotting disorder Neg Hx   . Menstrual problems Neg Hx     Social History   Socioeconomic History  . Marital status: Single    Spouse name: Not on file  . Number of children: Not on file  . Years of education: 9  . Highest education level: High school graduate  Occupational History  . Not on file  Tobacco Use  . Smoking status: Never  . Smokeless tobacco: Never  Vaping Use  . Vaping Use: Never used  Substance and Sexual Activity  . Alcohol use: No  . Drug use: No  . Sexual activity: Not Currently    Birth control/protection: Implant  Other Topics Concern  . Not on file  Social History Narrative   Family originally from [place]. Arrived 4 years ago, 3 years in [place]. Parents separated and dad is truck Electrical engineer Strain: Not on file  Food Insecurity: Not on file  Transportation Needs: Not on file  Physical Activity: Not on file  Stress: Not on file   Social Connections: Not on file  Intimate Partner Violence: Not on file    Outpatient Medications Prior to Visit  Medication Sig Dispense Refill  . etonogestrel (NEXPLANON) 68 MG IMPL implant 1 each by Subdermal route once. Placed 11/16/2018    . ibuprofen (ADVIL) 800 MG tablet Take 1 tablet (800 mg total) by mouth 3 (three) times daily. 21 tablet 0  . methocarbamol (ROBAXIN) 500 MG tablet Take 1 tablet (500 mg total) by mouth 2 (two) times daily. 20 tablet 0  . norethindrone (CAMILA) 0.35 MG tablet Take 1 tablet (0.35 mg total) by mouth daily. 28 tablet 3   No facility-administered medications prior to visit.    No Known Allergies  Review of Systems  Eyes:  Positive for blurred vision and photophobia.  Gastrointestinal:  Positive for nausea.  Neurological:  Positive for headaches.  All other systems reviewed and are negative.     Objective:    Physical Exam Vitals and nursing note reviewed.  Constitutional:      Appearance: Normal appearance.  HENT:     Head: Normocephalic.     Right Ear: External ear normal.     Nose: Nose normal. No congestion.     Mouth/Throat:     Mouth: Mucous membranes are moist.     Pharynx: Oropharynx is clear.  Cardiovascular:     Rate and  Rhythm: Normal rate and regular rhythm.     Pulses: Normal pulses.     Heart sounds: Normal heart sounds.  Pulmonary:     Effort: Pulmonary effort is normal.     Breath sounds: Normal breath sounds.  Abdominal:     General: Bowel sounds are normal.  Skin:    Findings: No rash.  Neurological:     Mental Status: She is alert and oriented to person, place, and time.  Psychiatric:        Mood and Affect: Mood normal.        Behavior: Behavior normal.    BP 111/63   Pulse 86   Temp 98.2 F (36.8 C) (Temporal)   Ht 5\' 3"  (1.6 m)   Wt 169 lb 8 oz (76.9 kg)   BMI 30.03 kg/m  Wt Readings from Last 3 Encounters:  11/10/20 169 lb 8 oz (76.9 kg)  05/03/20 155 lb 13.8 oz (70.7 kg)  01/03/20 155 lb 12.8  oz (70.7 kg)    Health Maintenance Due  Topic Date Due  . PAP-Cervical Cytology Screening  Never done  . PAP SMEAR-Modifier  Never done    There are no preventive care reminders to display for this patient.   Lab Results  Component Value Date   TSH 1.843 07/23/2014   Lab Results  Component Value Date   WBC 3.1 (L) 07/23/2014   HGB 13.4 01/03/2020   HCT 40.4 07/23/2014   MCV 84.3 07/23/2014   PLT 242 07/23/2014   Lab Results  Component Value Date   NA 139 09/22/2017   K 4.8 09/22/2017   CO2 23 09/22/2017   GLUCOSE 88 09/22/2017   BUN 7 09/22/2017   CREATININE 0.91 09/22/2017   BILITOT 0.3 07/23/2014   ALKPHOS 56 07/23/2014   AST 17 07/23/2014   ALT <8 07/23/2014   PROT 7.3 07/23/2014   ALBUMIN 4.4 07/23/2014   CALCIUM 9.6 09/22/2017   No results found for: CHOL No results found for: HDL No results found for: LDLCALC No results found for: TRIG No results found for: CHOLHDL No results found for: 09/24/2017     Assessment & Plan:   Problem List Items Addressed This Visit       Other   Frequent headaches - Primary    Patient is reporting severe headache in the past 4 to 5 months that lasts 2 hours with nausea, blurred vision and sensitivity to light and ER teams.  Ibuprofen has not helped.  After assessment patient is showing signs and symptoms of migraine. Education provided to patient with printed handouts given. Started patient on propranolol 10 mg tablet by mouth daily.  Samples of Nurtec given to patient for abortive treatment.  Patient verbalized understanding.  Follow-up with unresolved or worsening symptoms.      Relevant Medications   propranolol (INDERAL) 10 MG tablet   Rimegepant Sulfate (NURTEC) 75 MG TBDP     Meds ordered this encounter  Medications  . propranolol (INDERAL) 10 MG tablet    Sig: Take 1 tablet (10 mg total) by mouth 3 (three) times daily.    Dispense:  30 tablet    Refill:  5    Order Specific Question:   Supervising  Provider    Answer:   [id number] Raliegh Ip  . Rimegepant Sulfate (NURTEC) 75 MG TBDP    Sig: Take 75 mg by mouth once for 1 dose.    Dispense:  30 tablet    Refill:  0  Order Specific Question:   Supervising Provider    Answer:   Janora Norlander [[id number]]     Ivy Lynn, NP

## 2021-01-26 ENCOUNTER — Ambulatory Visit: Payer: Medicaid Other | Admitting: Nurse Practitioner

## 2021-02-05 ENCOUNTER — Encounter: Payer: Self-pay | Admitting: Family Medicine

## 2021-02-05 ENCOUNTER — Ambulatory Visit (INDEPENDENT_AMBULATORY_CARE_PROVIDER_SITE_OTHER): Payer: Medicaid Other | Admitting: Family Medicine

## 2021-02-05 VITALS — BP 105/70 | HR 93 | Wt 168.0 lb

## 2021-02-05 DIAGNOSIS — Z3046 Encounter for surveillance of implantable subdermal contraceptive: Secondary | ICD-10-CM | POA: Diagnosis not present

## 2021-02-05 NOTE — Progress Notes (Signed)
   BP 105/70   Pulse 93   Wt 168 lb (76.2 kg)   SpO2 97%   BMI 29.76 kg/m    Subjective:   Patient ID: Nicole Ochoa, female    DOB: 1995-06-21, 25 y.o.   MRN: [id number]  HPI: Nicole Ochoa is a 25 y.o. female presenting on 02/05/2021 for No chief complaint on file.   HPI Patient is coming in for Nexplanon removal.  She says that she has had irregular spotting and heavier longer cycles ever since she has had it for about the past 2 years and she wants it removed.  Says she is not currently sexually active and does not plan to near future and so does not want to discuss other forms of birth control at this time.  Relevant past medical, surgical, family and social history reviewed and updated as indicated. Interim medical history since our last visit reviewed. Allergies and medications reviewed and updated.  Review of Systems  Gastrointestinal:  Negative for abdominal pain.  Genitourinary:  Positive for menstrual problem and vaginal bleeding. Negative for pelvic pain and urgency.   Per HPI unless specifically indicated above   Allergies as of 02/05/2021   No Known Allergies      Medication List        Accurate as of February 05, 2021 10:32 AM. If you have any questions, ask your nurse or doctor.          STOP taking these medications    propranolol 10 MG tablet Commonly known as: INDERAL Stopped by: Elige Radon Richardo Popoff, MD       TAKE these medications    Nexplanon 68 MG Impl implant Generic drug: etonogestrel 1 each by Subdermal route once. Placed 11/16/2018         Objective:   BP 105/70   Pulse 93   Wt 168 lb (76.2 kg)   SpO2 97%   BMI 29.76 kg/m   Wt Readings from Last 3 Encounters:  02/05/21 168 lb (76.2 kg)  11/10/20 169 lb 8 oz (76.9 kg)  05/03/20 155 lb 13.8 oz (70.7 kg)    Physical Exam Vitals and nursing note reviewed.   Nexplanon removal: Nexplanon is palpable on her left upper inner arm. Site was prepped with Betadine. 68mL of 2%  lidocaine without epinephrine were used for anesthesia. 15 blade was used to dissect down to 1 and of the device. Forceps were used to grab the end of the device and extracted which came out intact and whole. Steri-Strips were placed to close the small incision. Pressure dressing was placed over the site to prevent bruising. Patient tolerated procedure well and bleeding was minimal.    Assessment & Plan:   Problem List Items Addressed This Visit   None Visit Diagnoses     Nexplanon removal    -  Primary        Follow up plan: Return if symptoms worsen or fail to improve.  Counseling provided for all of the vaccine components No orders of the defined types were placed in this encounter.   Arville Care, MD [facility] 02/05/2021, 10:32 AM

## 2021-02-11 ENCOUNTER — Other Ambulatory Visit: Payer: Self-pay | Admitting: Nurse Practitioner
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
## 2021-11-25 ENCOUNTER — Encounter: Payer: Medicaid Other | Admitting: Nurse Practitioner

## 2021-12-29 ENCOUNTER — Encounter: Payer: Medicaid Other | Admitting: Radiology

## 2022-02-02 ENCOUNTER — Encounter: Payer: Medicaid Other | Admitting: Nurse Practitioner

## 2022-02-03 ENCOUNTER — Encounter: Payer: Self-pay | Admitting: Nurse Practitioner

## 2022-02-12 ENCOUNTER — Emergency Department (HOSPITAL_COMMUNITY)
Admission: EM | Admit: 2022-02-12 | Discharge: 2022-02-13 | Discharge status: Against Medical Advice | Payer: Medicaid Other | Attending: Physician Assistant | Admitting: Physician Assistant

## 2022-02-12 ENCOUNTER — Ambulatory Visit (HOSPITAL_COMMUNITY)
Admission: EM | Admit: 2022-02-12 | Discharge: 2022-02-12 | Disposition: A | Discharge status: Home / Self Care | Payer: Medicaid Other | Attending: Emergency Medicine | Admitting: Emergency Medicine

## 2022-02-12 ENCOUNTER — Other Ambulatory Visit: Payer: Self-pay

## 2022-02-12 ENCOUNTER — Encounter (HOSPITAL_COMMUNITY): Payer: Self-pay | Admitting: Emergency Medicine

## 2022-02-12 DIAGNOSIS — Z5321 Procedure and treatment not carried out due to patient leaving prior to being seen by health care provider: Secondary | ICD-10-CM | POA: Diagnosis not present

## 2022-02-12 DIAGNOSIS — R1031 Right lower quadrant pain: Secondary | ICD-10-CM

## 2022-02-12 LAB — POCT URINALYSIS DIPSTICK, ED / UC
Bilirubin Urine: NEGATIVE
Glucose, UA: NEGATIVE mg/dL
Hgb urine dipstick: NEGATIVE
Leukocytes,Ua: NEGATIVE
Nitrite: NEGATIVE
Protein, ur: 30 mg/dL — AB
Specific Gravity, Urine: 1.02 (ref 1.005–1.030)
Urobilinogen, UA: 1 mg/dL (ref 0.0–1.0)
pH: 8.5 — ABNORMAL HIGH (ref 5.0–8.0)

## 2022-02-12 LAB — POC URINE PREG, ED: Preg Test, Ur: NEGATIVE

## 2022-02-12 NOTE — ED Triage Notes (Signed)
Pt reports right pelvic pressure, urinary frequency, vaginal discharge x 1 week and dysuria x 2 days. Has been taking AZO with no relief.

## 2022-02-12 NOTE — ED Triage Notes (Signed)
Patient coming to ED for evaluation of right lower abdominal/pelvic pain x 1 week.  Has had dysuria and vaginal discharge.  Seen at Urgent Care and sent here for evaluation.  No reports of fever

## 2022-02-12 NOTE — Discharge Instructions (Signed)
Please go to the emergency department for further evaluation.

## 2022-02-12 NOTE — Progress Notes (Deleted)
26 y.o. No obstetric history on file. Single other or two or more races female here for NEW GYN/annual exam.    PCP:     No LMP recorded.           Sexually active: {yes no:314532}  The current method of family planning is {contraception:315051}.    Exercising: {yes no:314532}  {types:19826} Smoker:  {YES NO:22349}  Health Maintenance: Pap:  *** History of abnormal Pap:  {YES NO:22349} MMG:  *** Colonoscopy:  *** BMD:   ***  Result  *** TDaP:  *** Gardasil:   {YES NO:22349} HIV: Hep C: Screening Labs:  Hb today: ***, Urine today: ***   reports that she has never smoked. She has never used smokeless tobacco. She reports that she does not drink alcohol and does not use drugs.  Past Medical History:  Diagnosis Date   Abdominal pain    ER visit   Episode of heavy vaginal bleeding     No past surgical history on file.  Current Outpatient Medications  Medication Sig Dispense Refill   etonogestrel (NEXPLANON) 68 MG IMPL implant 1 each by Subdermal route once. Placed 11/16/2018     No current facility-administered medications for this visit.    Family History  Problem Relation Age of Onset   Clotting disorder Neg Hx    Menstrual problems Neg Hx     Review of Systems  Exam:   There were no vitals taken for this visit.    General appearance: alert, cooperative and appears stated age Head: normocephalic, without obvious abnormality, atraumatic Neck: no adenopathy, supple, symmetrical, trachea midline and thyroid normal to inspection and palpation Lungs: clear to auscultation bilaterally Breasts: normal appearance, no masses or tenderness, No nipple retraction or dimpling, No nipple discharge or bleeding, No axillary adenopathy Heart: regular rate and rhythm Abdomen: soft, non-tender; no masses, no organomegaly Extremities: extremities normal, atraumatic, no cyanosis or edema Skin: skin color, texture, turgor normal. No rashes or lesions Lymph nodes: cervical,  supraclavicular, and axillary nodes normal. Neurologic: grossly normal  Pelvic: External genitalia:  no lesions              No abnormal inguinal nodes palpated.              Urethra:  normal appearing urethra with no masses, tenderness or lesions              Bartholins and Skenes: normal                 Vagina: normal appearing vagina with normal color and discharge, no lesions              Cervix: no lesions              Pap taken: {yes no:314532} Bimanual Exam:  Uterus:  normal size, contour, position, consistency, mobility, non-tender              Adnexa: no mass, fullness, tenderness              Rectal exam: {yes no:314532}.  Confirms.              Anus:  normal sphincter tone, no lesions  Chaperone was present for exam:  ***  Assessment:   Well woman visit with gynecologic exam.   Plan: Mammogram screening discussed. Self breast awareness reviewed. Pap and HR HPV as above. Guidelines for Calcium, Vitamin D, regular exercise program including cardiovascular and weight bearing exercise.   Follow up annually and prn.  Additional counseling given.  {yes [id number]. _______ minutes face to face time of which over 50% was spent in counseling.    After visit summary provided.

## 2022-02-12 NOTE — ED Provider Notes (Signed)
[facility]    CSN: [id number] Arrival date & time: 02/12/22  1914      History   Chief Complaint Chief Complaint  Patient presents with   Pelvic Pressure   Urinary Frequency    HPI Ruthanne Mcneish is a 26 y.o. female.  Presents with right lower abdominal pain x 1 week, worsening over the last several days. Feels pressure. Did not respond to tylenol or ibuprofen. Dysuria began 2 days ago  No vomiting or diarrhea. Appetite somewhat decreased. No fevers.  Reports 1 week of vaginal discharge.  Not sexually active. Denies bleeding or spotting  Past Medical History:  Diagnosis Date   Abdominal pain    ER visit   Episode of heavy vaginal bleeding     Patient Active Problem List   Diagnosis Date Noted   Insomnia 09/22/2017   Dizziness 09/22/2017   Frequent headaches 09/22/2017   Dysmenorrhea 03/21/2017   Abdominal pain, unspecified site 06/29/2013   Unspecified constipation 06/29/2013    History reviewed. No pertinent surgical history.  OB History   No obstetric history on file.      Home Medications    Prior to Admission medications   Medication Sig Start Date End Date Taking? Authorizing Provider  etonogestrel (NEXPLANON) 68 MG IMPL implant 1 each by Subdermal route once. Placed 11/16/2018    [provider]    Family History Family History  Problem Relation Age of Onset   Clotting disorder Neg Hx    Menstrual problems Neg Hx     Social History Social History   Tobacco Use   Smoking status: Never   Smokeless tobacco: Never  Vaping Use   Vaping Use: Never used  Substance Use Topics   Alcohol use: No   Drug use: No     Allergies   Patient has no known allergies.   Review of Systems Review of Systems Per HPI  Physical Exam Triage Vital Signs ED Triage Vitals  Enc Vitals Group     BP 02/12/22 1948 100/60     Pulse Rate 02/12/22 1948 79     Resp 02/12/22 1948 18     Temp 02/12/22 1948 98 F (36.7 C)     Temp  Source 02/12/22 1948 Oral     SpO2 02/12/22 1948 99 %     Weight --      Height --      Head Circumference --      Peak Flow --      Pain Score 02/12/22 1947 7     Pain Loc --      Pain Edu? --      Excl. in GC? --    No data found.  Updated Vital Signs BP 100/60 (BP Location: Right Arm)   Pulse 79   Temp 98 F (36.7 C) (Oral)   Resp 18   SpO2 99%   Physical Exam Vitals and nursing note reviewed.  Constitutional:      Appearance: Normal appearance.  HENT:     Mouth/Throat:     Pharynx: Oropharynx is clear.  Cardiovascular:     Rate and Rhythm: Normal rate and regular rhythm.  Pulmonary:     Effort: Pulmonary effort is normal.  Abdominal:     Palpations: Abdomen is soft.     Tenderness: There is abdominal tenderness in the right lower quadrant. There is rebound. There is no guarding. Positive signs include McBurney's sign. Negative signs include Rovsing's sign.     Comments:  Reports 8/10 pain with palpation. Rebound tenderness. 10/10 rebound.   Skin:    General: Skin is warm and dry.     Findings: No rash.  Neurological:     Mental Status: She is alert and oriented to person, place, and time.    UC Treatments / Results  Labs (all labs ordered are listed, but only abnormal results are displayed) Labs Reviewed  POCT URINALYSIS DIPSTICK, ED / UC - Abnormal; Notable for the following components:      Result Value   Ketones, ur TRACE (*)    pH 8.5 (*)    Protein, ur 30 (*)    All other components within normal limits  POC URINE PREG, ED  CERVICOVAGINAL ANCILLARY ONLY    EKG  Radiology No results found.  Procedures Procedures   Medications Ordered in UC Medications - No data to display  Initial Impression / Assessment and Plan / UC Course  I have reviewed the triage vital signs and the nursing notes.  Pertinent labs & imaging results that were available during my care of the patient were reviewed by me and considered in my medical decision making (see  chart for details).  UA unremarkable, no sign of infection With abdominal exam, concern for RLQ pain with rebound tenderness.  Patient overall appears well and has stable vitals, but cannot rule out emergent appendix or ovarian etiology.  Patient would like to go to the emergency department for further evaluation. This is reasonable given her exam. She is discharged to the ED via POV.  Final Clinical Impressions(s) / UC Diagnoses   Final diagnoses:  RLQ abdominal pain     Discharge Instructions      Please go to the emergency department for further evaluation.     ED Prescriptions   None    PDMP not reviewed this encounter.   Cleo Santucci, Ray Church 02/12/22 2041

## 2022-02-12 NOTE — ED Notes (Signed)
Patient is being discharged from the Urgent Care and sent to the Emergency Department via POV . Per [facility], patient is in need of higher level of care due to due to higher level of care needed. Patient is aware and verbalizes understanding of plan of care.  Vitals:   02/12/22 1948  BP: 100/60  Pulse: 79  Resp: 18  Temp: 98 F (36.7 C)  SpO2: 99%

## 2022-02-12 NOTE — ED Provider Triage Note (Cosign Needed)
Emergency Medicine Provider Triage Evaluation Note  Nicole Ochoa , a 26 y.o. female  was evaluated in triage.  Pt complains of lower quadrant pain for 1 week.  Started suprapubic region.  Was seen by urgent care concern for appendicitis, sent here.  No flank pain, hematuria.  No concern for STD.  Urinalysis from urgent care negative for infection Pregnancy test negative from urgent care  Review of Systems  Positive: Rlq pain Negative: Fever, hematuria, flank pain  Physical Exam  There were no vitals taken for this visit. Gen:   Awake, no distress   Resp:  Normal effort  MSK:   Moves extremities without difficulty  Other:    Medical Decision Making  Medically screening exam initiated at 11:22 PM.  Appropriate orders placed.  Nicole Ochoa was informed that the remainder of the evaluation will be completed by another provider, this initial triage assessment does not replace that evaluation, and the importance of remaining in the ED until their evaluation is complete.  Rlq pain x 1 week   Nicole Ochoa A, PA-C 02/12/22 2324

## 2022-02-13 ENCOUNTER — Emergency Department (HOSPITAL_COMMUNITY): Payer: Medicaid Other

## 2022-02-13 LAB — CBC WITH DIFFERENTIAL/PLATELET
Abs Immature Granulocytes: 0.01 10*3/uL (ref 0.00–0.07)
Basophils Absolute: 0 10*3/uL (ref 0.0–0.1)
Basophils Relative: 0 %
Eosinophils Absolute: 0.1 10*3/uL (ref 0.0–0.5)
Eosinophils Relative: 2 %
HCT: 39.8 % (ref 36.0–46.0)
Hemoglobin: 12.7 g/dL (ref 12.0–15.0)
Immature Granulocytes: 0 %
Lymphocytes Relative: 53 %
Lymphs Abs: 3.2 10*3/uL (ref 0.7–4.0)
MCH: 28.6 pg (ref 26.0–34.0)
MCHC: 31.9 g/dL (ref 30.0–36.0)
MCV: 89.6 fL (ref 80.0–100.0)
Monocytes Absolute: 0.3 10*3/uL (ref 0.1–1.0)
Monocytes Relative: 6 %
Neutro Abs: 2.3 10*3/uL (ref 1.7–7.7)
Neutrophils Relative %: 39 %
Platelets: 246 10*3/uL (ref 150–400)
RBC: 4.44 MIL/uL (ref 3.87–5.11)
RDW: 13.3 % (ref 11.5–15.5)
WBC: 5.9 10*3/uL (ref 4.0–10.5)
nRBC: 0 % (ref 0.0–0.2)

## 2022-02-13 LAB — COMPREHENSIVE METABOLIC PANEL
ALT: 13 U/L (ref 0–44)
AST: 20 U/L (ref 15–41)
Albumin: 4.2 g/dL (ref 3.5–5.0)
Alkaline Phosphatase: 52 U/L (ref 38–126)
Anion gap: 6 (ref 5–15)
BUN: 15 mg/dL (ref 6–20)
CO2: 24 mmol/L (ref 22–32)
Calcium: 9.1 mg/dL (ref 8.9–10.3)
Chloride: 109 mmol/L (ref 98–111)
Creatinine, Ser: 0.96 mg/dL (ref 0.44–1.00)
GFR, Estimated: >60 mL/min (ref >60)
Glucose, Bld: 93 mg/dL (ref 70–99)
Potassium: 3.9 mmol/L (ref 3.5–5.1)
Sodium: 139 mmol/L (ref 135–145)
Total Bilirubin: 0.4 mg/dL (ref 0.3–1.2)
Total Protein: 7.3 g/dL (ref 6.5–8.1)

## 2022-02-13 LAB — LIPASE, BLOOD: Lipase: 50 U/L (ref 11–51)

## 2022-02-13 MED ORDER — SODIUM CHLORIDE (PF) 0.9 % IJ SOLN
INTRAMUSCULAR | Status: AC
  Filled 2022-02-13: qty 50

## 2022-02-13 MED ORDER — IOHEXOL 300 MG/ML  SOLN
100.0000 mL | Freq: Once | INTRAMUSCULAR | Status: AC | PRN
  Administered 2022-02-13: 100 mL via INTRAVENOUS

## 2022-02-13 NOTE — ED Notes (Signed)
PT request to leave. RN aware. IV remove

## 2022-02-13 NOTE — Progress Notes (Incomplete)
20 g IV placed in right AC, patient advised if she wanted to leave before being seen to place notify nursing staff so that they may remove the IV.  Patient stated "she understood"

## 2022-02-13 NOTE — ED Notes (Signed)
Removing pt from system after called 3 times earlier today without answer.

## 2022-02-15 ENCOUNTER — Telehealth: Payer: Self-pay | Admitting: Nurse Practitioner

## 2022-02-15 NOTE — Telephone Encounter (Signed)
Transition Care Management Unsuccessful Follow-up Telephone Call  Date of discharge and from where:  02/12/22 [facility]  Attempts:  1st Attempt  Reason for unsuccessful TCM follow-up call:  Left voice message

## 2022-02-16 ENCOUNTER — Encounter: Payer: Medicaid Other | Admitting: Obstetrics and Gynecology

## 2022-04-30 ENCOUNTER — Telehealth: Payer: Medicaid Other | Admitting: Physician Assistant

## 2022-04-30 DIAGNOSIS — M62838 Other muscle spasm: Secondary | ICD-10-CM

## 2022-04-30 DIAGNOSIS — N926 Irregular menstruation, unspecified: Secondary | ICD-10-CM

## 2022-04-30 DIAGNOSIS — M545 Low back pain, unspecified: Secondary | ICD-10-CM

## 2022-04-30 DIAGNOSIS — G44209 Tension-type headache, unspecified, not intractable: Secondary | ICD-10-CM

## 2022-04-30 DIAGNOSIS — K2960 Other gastritis without bleeding: Secondary | ICD-10-CM

## 2022-04-30 DIAGNOSIS — T39395A Adverse effect of other nonsteroidal anti-inflammatory drugs [NSAID], initial encounter: Secondary | ICD-10-CM

## 2022-04-30 MED ORDER — OMEPRAZOLE 40 MG PO CPDR: 40.0000 mg | DELAYED_RELEASE_CAPSULE | Freq: Every day | ORAL | 0 refills | Status: DC

## 2022-04-30 MED ORDER — CYCLOBENZAPRINE HCL 10 MG PO TABS: 5.0000 mg | ORAL_TABLET | Freq: Three times a day (TID) | ORAL | 0 refills | Status: DC | PRN

## 2022-04-30 MED ORDER — PREDNISONE 10 MG (21) PO TBPK: ORAL_TABLET | ORAL | 0 refills | Status: DC

## 2022-04-30 NOTE — Progress Notes (Signed)
Virtual Visit Consent   Nicole Ochoa, you are scheduled for a virtual visit with a [facility] provider today. Just as with appointments in the office, your consent must be obtained to participate. Your consent will be active for this visit and any virtual visit you may have with one of our providers in the next 365 days. If you have a MyChart account, a copy of this consent can be sent to you electronically.  As this is a virtual visit, video technology does not allow for your provider to perform a traditional examination. This may limit your provider's ability to fully assess your condition. If your provider identifies any concerns that need to be evaluated in person or the need to arrange testing (such as labs, EKG, etc.), we will make arrangements to do so. Although advances in technology are sophisticated, we cannot ensure that it will always work on either your end or our end. If the connection with a video visit is poor, the visit may have to be switched to a telephone visit. With either a video or telephone visit, we are not always able to ensure that we have a secure connection.  By engaging in this virtual visit, you consent to the provision of healthcare and authorize for your insurance to be billed (if applicable) for the services provided during this visit. Depending on your insurance coverage, you may receive a charge related to this service.  I need to obtain your verbal consent now. Are you willing to proceed with your visit today? Kateland Mowery has provided verbal consent on 04/30/2022 for a virtual visit (video or telephone). Mar Daring, PA-C  Date: 04/30/2022 8:15 AM  Virtual Visit via Video Note   I, Mar Daring, connected with  Siannah Fouts  ([id number], 01/22/1996) on 04/30/22 at  8:00 AM EST by a video-enabled telemedicine application and verified that I am speaking with the correct person using two identifiers.  Location: Patient: Virtual Visit Location Patient:  Home Provider: Virtual Visit Location Provider: Home Office   I discussed the limitations of evaluation and management by telemedicine and the availability of in person appointments. The patient expressed understanding and agreed to proceed.    History of Present Illness: Nicole Ochoa is a 27 y.o. who identifies as a female who was assigned female at birth, and is being seen today for missed periods with having back pain, heartburn, and a migraine.  Missed period: Skipped last month. Not sexually active at this time. No birth control.  Back pain: Initial symptom prior to headaches and heartburn starting. Felt significant bilaterally in low back. Radiates to neck. Does not radiate to legs. Feels a pulling sensation. Is not limiting physical activity. Denies urine frequency, odor, dysuria, or change in appearance. Denies bowel changes.  Headache: Feels similar to previous headaches. Feels like a squeezing pain. Reports it squeezes from top like a head-band. Denies visual changes or hearing changes.   Heartburn: started in last 6 days with a deep burning pain sub-sternally. Aggravated by food and drink.   Problems:  Patient Active Problem List   Diagnosis Date Noted   Insomnia 09/22/2017   Dizziness 09/22/2017   Frequent headaches 09/22/2017   Dysmenorrhea 03/21/2017   Abdominal pain, unspecified site 06/29/2013   Unspecified constipation 06/29/2013    Allergies: No Known Allergies Medications:  Current Outpatient Medications:    cyclobenzaprine (FLEXERIL) 10 MG tablet, Take 0.5-1 tablets (5-10 mg total) by mouth 3 (three) times daily as needed., Disp: 30 tablet, Rfl:  0   omeprazole (PRILOSEC) 40 MG capsule, Take 1 capsule (40 mg total) by mouth daily., Disp: 30 capsule, Rfl: 0   predniSONE (STERAPRED UNI-PAK 21 TAB) 10 MG (21) TBPK tablet, 6 day taper; take instructions on package instructions, Disp: 21 tablet, Rfl: 0   etonogestrel (NEXPLANON) 68 MG IMPL implant, 1 each by Subdermal  route once. Placed 11/16/2018, Disp: , Rfl:   Observations/Objective: Patient is well-developed, well-nourished in no acute distress.  Resting comfortably at home.  Head is normocephalic, atraumatic.  No labored breathing.  Speech is clear and coherent with logical content.  Patient is alert and oriented at baseline.    Assessment and Plan: 1. Muscle spasm - cyclobenzaprine (FLEXERIL) 10 MG tablet; Take 0.5-1 tablets (5-10 mg total) by mouth 3 (three) times daily as needed.  Dispense: 30 tablet; Refill: 0 - predniSONE (STERAPRED UNI-PAK 21 TAB) 10 MG (21) TBPK tablet; 6 day taper; take instructions on package instructions  Dispense: 21 tablet; Refill: 0  2. Acute bilateral low back pain without sciatica - cyclobenzaprine (FLEXERIL) 10 MG tablet; Take 0.5-1 tablets (5-10 mg total) by mouth 3 (three) times daily as needed.  Dispense: 30 tablet; Refill: 0 - predniSONE (STERAPRED UNI-PAK 21 TAB) 10 MG (21) TBPK tablet; 6 day taper; take instructions on package instructions  Dispense: 21 tablet; Refill: 0  3. Tension headache - cyclobenzaprine (FLEXERIL) 10 MG tablet; Take 0.5-1 tablets (5-10 mg total) by mouth 3 (three) times daily as needed.  Dispense: 30 tablet; Refill: 0 - predniSONE (STERAPRED UNI-PAK 21 TAB) 10 MG (21) TBPK tablet; 6 day taper; take instructions on package instructions  Dispense: 21 tablet; Refill: 0  4. NSAID induced gastritis - omeprazole (PRILOSEC) 40 MG capsule; Take 1 capsule (40 mg total) by mouth daily.  Dispense: 30 capsule; Refill: 0  - Suspect back pain/spasm triggering tension headache - Recent frequent use of Ibuprofen triggering gastritis - Flexeril and prednisone for back pain and headache - Avoid Ibuprofen or Naproxen (NSAIDs) - Tylenol can be used as needed for pain - Heating pad for back - Light stretches - Omeprazole for gastritis - Not sexually active so pregnancy not a concern for missed menses - Discussed hormonal imbalance, vitamin  deficiencies, ovarian cyst (PCOS), uterine fibroids, etc as all possible causes - Advised to monitor menstrual cycle and if skips a second month to be seen in person for further work up for cause; she voiced understanding - Seek in person evaluation sooner if all other symptoms worsen or fail to improve   Follow Up Instructions: I discussed the assessment and treatment plan with the patient. The patient was provided an opportunity to ask questions and all were answered. The patient agreed with the plan and demonstrated an understanding of the instructions.  A copy of instructions were sent to the patient via MyChart unless otherwise noted below.    The patient was advised to call back or seek an in-person evaluation if the symptoms worsen or if the condition fails to improve as anticipated.  Time:  I spent 18 minutes with the patient via telehealth technology discussing the above problems/concerns.    Mar Daring, PA-C

## 2022-04-30 NOTE — Patient Instructions (Signed)
Nicole Ochoa, thank you for joining Mar Daring, PA-C for today's virtual visit.  While this provider is not your primary care provider (PCP), if your PCP is located in our provider database this encounter information will be shared with them immediately following your visit.   [facility] account gives you access to today's visit and all your visits, tests, and labs performed at [facility] " click here if you don't have a [facility] account or go to [email or web address]  Consent: (Patient) Nicole Ochoa provided verbal consent for this virtual visit at the beginning of the encounter.  Current Medications:  Current Outpatient Medications:    cyclobenzaprine (FLEXERIL) 10 MG tablet, Take 0.5-1 tablets (5-10 mg total) by mouth 3 (three) times daily as needed., Disp: 30 tablet, Rfl: 0   omeprazole (PRILOSEC) 40 MG capsule, Take 1 capsule (40 mg total) by mouth daily., Disp: 30 capsule, Rfl: 0   predniSONE (STERAPRED UNI-PAK 21 TAB) 10 MG (21) TBPK tablet, 6 day taper; take instructions on package instructions, Disp: 21 tablet, Rfl: 0   etonogestrel (NEXPLANON) 68 MG IMPL implant, 1 each by Subdermal route once. Placed 11/16/2018, Disp: , Rfl:    Medications ordered in this encounter:  Meds ordered this encounter  Medications   cyclobenzaprine (FLEXERIL) 10 MG tablet    Sig: Take 0.5-1 tablets (5-10 mg total) by mouth 3 (three) times daily as needed.    Dispense:  30 tablet    Refill:  0    Order Specific Question:   Supervising Provider    Answer:   Chase Picket [id number]   predniSONE (STERAPRED UNI-PAK 21 TAB) 10 MG (21) TBPK tablet    Sig: 6 day taper; take instructions on package instructions    Dispense:  21 tablet    Refill:  0    Order Specific Question:   Supervising Provider    Answer:   Chase Picket [id number]   omeprazole (PRILOSEC) 40 MG capsule    Sig: Take 1 capsule (40 mg total) by mouth daily.    Dispense:  30 capsule     Refill:  0    Order Specific Question:   Supervising Provider    Answer:   Chase Picket [id number]     *If you need refills on other medications prior to your next appointment, please contact your pharmacy*  Follow-Up: Call back or seek an in-person evaluation if the symptoms worsen or if the condition fails to improve as anticipated.  [place] [phone]  Other Instructions  Tension Headache, Adult A tension headache is a feeling of pain, pressure, or aching over the front and sides of the head. The pain can be dull, or it can feel tight. There are two types of tension headache: Episodic tension headache. This is when the headaches happen fewer than 15 days a month. Chronic tension headache. This is when the headaches happen more than 15 days a month during a 48-monthperiod. A tension headache can last from 30 minutes to several days. It is the most common kind of headache. Tension headaches are not normally associated with nausea or vomiting, and they do not get worse with physical activity. What are the causes? The exact cause of this condition is not known. Tension headaches are often triggered by stress, anxiety, or depression. Other triggers may include: Alcohol. Too much caffeine or caffeine withdrawal. Respiratory infections, such as colds, flu, or sinus infections. Dental problems or teeth clenching. Fatigue.  Holding your head and neck in the same position for a long period of time, such as while using a computer. Smoking. Arthritis of the neck. What are the signs or symptoms? Symptoms of this condition include: A feeling of pressure or tightness around the head. Dull, aching head pain. Pain over the front and sides of the head. Tenderness in the muscles of the head, neck, and shoulders. How is this diagnosed? This condition may be diagnosed based on your symptoms, your medical history, and a physical exam. If your symptoms are severe or  unusual, you may have imaging tests, such as a CT scan or an MRI of your head. Your vision may also be checked. How is this treated? This condition may be treated with lifestyle changes and with medicines that help relieve symptoms. Follow these instructions at home: Managing pain Take over-the-counter and prescription medicines only as told by your health care provider. When you have a headache, lie down in a dark, quiet room. If directed, put ice on your head and neck. To do this: Put ice in a plastic bag. Place a towel between your skin and the bag. Leave the ice on for 20 minutes, 2-3 times a day. Remove the ice if your skin turns bright red. This is very important. If you cannot feel pain, heat, or cold, you have a greater risk of damage to the area. If directed, apply heat to the back of your neck as often as told by your health care provider. Use the heat source that your health care provider recommends, such as a moist heat pack or a heating pad. Place a towel between your skin and the heat source. Leave the heat on for 20-30 minutes. Remove the heat if your skin turns bright red. This is especially important if you are unable to feel pain, heat, or cold. You have a greater risk of getting burned. Eating and drinking Eat meals on a regular schedule. If you drink alcohol: Limit how much you have to: 0-1 drink a day for women who are not pregnant. 0-2 drinks a day for men. Know how much alcohol is in your drink. In the [place] one drink equals one 12 oz bottle of beer (355 mL), one 5 oz glass of wine (148 mL), or one 1 oz glass of hard liquor (44 mL). Drink enough fluid to keep your urine pale yellow. Decrease your caffeine intake, or stop using caffeine. Lifestyle Get 7-9 hours of sleep each night, or get the amount of sleep recommended by your health care provider. At bedtime, remove computers, phones, and tablets from your room. Find ways to manage your stress. This may  include: Exercise. Deep breathing exercises. Yoga. Listening to music. Positive mental imagery. Try to sit up straight and avoid tensing your muscles. Do not use any products that contain nicotine or tobacco. These include cigarettes, chewing tobacco, and vaping devices, such as e-cigarettes. If you need help quitting, ask your health care provider. General instructions  Avoid any headache triggers. Keep a journal to help find out what may trigger your headaches. For example, write down: What you eat and drink. How much sleep you get. Any change to your diet or medicines. Keep all follow-up visits. This is important. Contact a health care provider if: Your headache does not get better. Your headache comes back. You are sensitive to sounds, light, or smells because of a headache. You have nausea or you vomit. Your stomach hurts. Get help right away if: You  suddenly develop a severe headache, along with any of the following: A stiff neck. Nausea and vomiting. Confusion. Weakness in one part or one side of your body. Double vision or loss of vision. Shortness of breath. Rash. Unusual sleepiness. Fever or chills. Trouble speaking. Pain in your eye or ear. Trouble walking or balancing. Feeling faint or passing out. Summary A tension headache is a feeling of pain, pressure, or aching over the front and sides of the head. A tension headache can last from 30 minutes to several days. It is the most common kind of headache. This condition may be diagnosed based on your symptoms, your medical history, and a physical exam. This condition may be treated with lifestyle changes and with medicines that help relieve symptoms. This information is not intended to replace advice given to you by your health care provider. Make sure you discuss any questions you have with your health care provider. Document Revised: 11/15/2019 Document Reviewed: 11/15/2019 Elsevier Patient Education  [place].   Back Exercises These exercises help to make your trunk and back strong. They also help to keep the lower back flexible. Doing these exercises can help to prevent or lessen pain in your lower back. If you have back pain, try to do these exercises 2-3 times each day or as told by your doctor. As you get better, do the exercises once each day. Repeat the exercises more often as told by your doctor. To stop back pain from coming back, do the exercises once each day, or as told by your doctor. Do exercises exactly as told by your doctor. Stop right away if you feel sudden pain or your pain gets worse. Exercises Single knee to chest Do these steps 3-5 times in a row for each leg: Lie on your back on a firm bed or the floor with your legs stretched out. Bring one knee to your chest. Grab your knee or thigh with both hands and hold it in place. Pull on your knee until you feel a gentle stretch in your lower back or butt. Keep doing the stretch for 10-30 seconds. Slowly let go of your leg and straighten it. Pelvic tilt Do these steps 5-10 times in a row: Lie on your back on a firm bed or the floor with your legs stretched out. Bend your knees so they point up to the ceiling. Your feet should be flat on the floor. Tighten your lower belly (abdomen) muscles to press your lower back against the floor. This will make your tailbone point up to the ceiling instead of pointing down to your feet or the floor. Stay in this position for 5-10 seconds while you gently tighten your muscles and breathe evenly. Cat-cow Do these steps until your lower back bends more easily: Get on your hands and knees on a firm bed or the floor. Keep your hands under your shoulders, and keep your knees under your hips. You may put padding under your knees. Let your head hang down toward your chest. Tighten (contract) the muscles in your belly. Point your tailbone toward the floor so your lower back becomes rounded  like the back of a cat. Stay in this position for 5 seconds. Slowly lift your head. Let the muscles of your belly relax. Point your tailbone up toward the ceiling so your back forms a sagging arch like the back of a cow. Stay in this position for 5 seconds.  Press-ups Do these steps 5-10 times in a row:  Lie on your belly (face-down) on a firm bed or the floor. Place your hands near your head, about shoulder-width apart. While you keep your back relaxed and keep your hips on the floor, slowly straighten your arms to raise the top half of your body and lift your shoulders. Do not use your back muscles. You may change where you place your hands to make yourself more comfortable. Stay in this position for 5 seconds. Keep your back relaxed. Slowly return to lying flat on the floor.  Bridges Do these steps 10 times in a row: Lie on your back on a firm bed or the floor. Bend your knees so they point up to the ceiling. Your feet should be flat on the floor. Your arms should be flat at your sides, next to your body. Tighten your butt muscles and lift your butt off the floor until your waist is almost as high as your knees. If you do not feel the muscles working in your butt and the back of your thighs, slide your feet 1-2 inches (2.5-5 cm) farther away from your butt. Stay in this position for 3-5 seconds. Slowly lower your butt to the floor, and let your butt muscles relax. If this exercise is too easy, try doing it with your arms crossed over your chest. Belly crunches Do these steps 5-10 times in a row: Lie on your back on a firm bed or the floor with your legs stretched out. Bend your knees so they point up to the ceiling. Your feet should be flat on the floor. Cross your arms over your chest. Tip your chin a little bit toward your chest, but do not bend your neck. Tighten your belly muscles and slowly raise your chest just enough to lift your shoulder blades a tiny bit off the floor. Avoid  raising your body higher than that because it can put too much stress on your lower back. Slowly lower your chest and your head to the floor. Back lifts Do these steps 5-10 times in a row: Lie on your belly (face-down) with your arms at your sides, and rest your forehead on the floor. Tighten the muscles in your legs and your butt. Slowly lift your chest off the floor while you keep your hips on the floor. Keep the back of your head in line with the curve in your back. Look at the floor while you do this. Stay in this position for 3-5 seconds. Slowly lower your chest and your face to the floor. Contact a doctor if: Your back pain gets a lot worse when you do an exercise. Your back pain does not get better within 2 hours after you exercise. If you have any of these problems, stop doing the exercises. Do not do them again unless your doctor says it is okay. Get help right away if: You have sudden, very bad back pain. If this happens, stop doing the exercises. Do not do them again unless your doctor says it is okay. This information is not intended to replace advice given to you by your health care provider. Make sure you discuss any questions you have with your health care provider. Document Revised: 04/30/2020 Document Reviewed: 04/30/2020 Elsevier Patient Education  [place].    If you have been instructed to have an in-person evaluation today at a local Urgent Care facility, please use the link below. It will take you to a list of all of our available [facility], including address,  phone number and hours of operation. Please do not delay care.  [facility]  If you or a family member do not have a primary care provider, use the link below to schedule a visit and establish care. When you choose a [facility] primary care physician or advanced practice provider, you gain a long-term partner in health. Find a Primary Care Provider  Learn more about [facility]'s in-office and virtual care options: [facility]

## 2022-05-19 ENCOUNTER — Encounter: Payer: Medicaid Other | Admitting: Radiology

## 2022-10-15 ENCOUNTER — Telehealth: Payer: Medicaid Other | Admitting: Nurse Practitioner

## 2022-10-15 DIAGNOSIS — R079 Chest pain, unspecified: Secondary | ICD-10-CM

## 2022-10-15 NOTE — Progress Notes (Signed)
Virtual Visit Consent   Nicole Ochoa, you are scheduled for a virtual visit with a [facility] provider today. Just as with appointments in the office, your consent must be obtained to participate. Your consent will be active for this visit and any virtual visit you may have with one of our providers in the next 365 days. If you have a MyChart account, a copy of this consent can be sent to you electronically.  As this is a virtual visit, video technology does not allow for your provider to perform a traditional examination. This may limit your provider's ability to fully assess your condition. If your provider identifies any concerns that need to be evaluated in person or the need to arrange testing (such as labs, EKG, etc.), we will make arrangements to do so. Although advances in technology are sophisticated, we cannot ensure that it will always work on either your end or our end. If the connection with a video visit is poor, the visit may have to be switched to a telephone visit. With either a video or telephone visit, we are not always able to ensure that we have a secure connection.  By engaging in this virtual visit, you consent to the provision of healthcare and authorize for your insurance to be billed (if applicable) for the services provided during this visit. Depending on your insurance coverage, you may receive a charge related to this service.  I need to obtain your verbal consent now. Are you willing to proceed with your visit today? Nicole Ochoa has provided verbal consent on 10/15/2022 for a virtual visit (video or telephone). Nicole Simas, FNP  Date: 10/15/2022 8:51 AM  Virtual Visit via Video Note   I, Nicole Ochoa, connected with  Nicole Ochoa  ([id number], 09-16-1995) on 10/15/22 at  9:00 AM EDT by a video-enabled telemedicine application and verified that I am speaking with the correct person using two identifiers.  Location: Patient: Virtual Visit Location Patient:  Home Provider: Virtual Visit Location Provider: Home Office   I discussed the limitations of evaluation and management by telemedicine and the availability of in person appointments. The patient expressed understanding and agreed to proceed.    History of Present Illness: Nicole Ochoa is a 27 y.o. who identifies as a female who was assigned female at birth, and is being seen today for pain in her chest that radiates so her back.   Pain originates in right chest, into her shoulder, into her upper back   This has been present for the past 2 days  When the pain onsets she experiences nausea with the pain and has vomited, this happened once already today.   When she experiences the pain she feels as though her heart is racing   The pain will come and go for up to two hours   Denies any history of heartburn or reflux  Prior to onset of pain she denies any trauma, heavy lifting or new exercises  Denies any recent travel   Denies any recent illness  She did have an episode of diarrhea one week ago   History is significant for insomnia, frequent headaches, dysmenorrhea  She has been seen in UC/ED in the past for abdominal pain with negative workup and constipation   She has tried using tylenol for pain relief and will feel relief for an hour or so and then will have return of symptoms   Her menses on the 5th of this month but has not had it yet  Denies change of pregnancy  Denies sexual activity  Is no on an OCP   She is in the process of moving to [place]   Problems:  Patient Active Problem List   Diagnosis Date Noted   Insomnia 09/22/2017   Dizziness 09/22/2017   Frequent headaches 09/22/2017   Dysmenorrhea 03/21/2017   Abdominal pain, unspecified site 06/29/2013   Unspecified constipation 06/29/2013    Allergies: No Known Allergies Medications:  Current Outpatient Medications:    cyclobenzaprine (FLEXERIL) 10 MG tablet, Take 0.5-1 tablets (5-10 mg total) by mouth 3 (three)  times daily as needed., Disp: 30 tablet, Rfl: 0   etonogestrel (NEXPLANON) 68 MG IMPL implant, 1 each by Subdermal route once. Placed 11/16/2018, Disp: , Rfl:    omeprazole (PRILOSEC) 40 MG capsule, Take 1 capsule (40 mg total) by mouth daily., Disp: 30 capsule, Rfl: 0   predniSONE (STERAPRED UNI-PAK 21 TAB) 10 MG (21) TBPK tablet, 6 day taper; take instructions on package instructions, Disp: 21 tablet, Rfl: 0  Observations/Objective: Patient is well-developed, well-nourished in no acute distress.  Resting comfortably  at home.  Head is normocephalic, atraumatic.  No labored breathing.  Speech is clear and coherent with logical content.  Patient is alert and oriented at baseline.    Assessment and Plan: 1. Chest pain, unspecified type Advised ED visit for evaluation due to chest pain with N/V and heart palpitations  Patient has transportation and will go to local ED for evaluation  Advised calling 911 for any acute change in symptoms, as of now she is safe to be transported to ED on the understanding she will go now and not drive.     Follow Up Instructions: I discussed the assessment and treatment plan with the patient. The patient was provided an opportunity to ask questions and all were answered. The patient agreed with the plan and demonstrated an understanding of the instructions.  A copy of instructions were sent to the patient via MyChart unless otherwise noted below.    The patient was advised to call back or seek an in-person evaluation if the symptoms worsen or if the condition fails to improve as anticipated.  Time:  I spent 11 minutes with the patient via telehealth technology discussing the above problems/concerns.    Nicole Simas, FNP

## 2023-05-30 ENCOUNTER — Telehealth: Admitting: Physician Assistant

## 2023-05-30 DIAGNOSIS — N926 Irregular menstruation, unspecified: Secondary | ICD-10-CM

## 2023-05-30 NOTE — Progress Notes (Signed)
 Because of having irregular menstrual cycles,  I feel your condition warrants further evaluation and I recommend that you be seen in a face to face visit with your gynecologist or at one of our [facility] clinics.   NOTE: There will be NO CHARGE for this eVisit   If you are having a true medical emergency please call 911.    *Center for [facility]             [address] [place], [place] [address] [phone] (*Take patients with no insurance)  *Center for [facility] at [facility] [address] [place], [place],  [place]  [address] [phone] (*Take patients with no insurance)  Center for [employer] at [facility]                                                             [address], [address], [place], [place], [address] [phone]  Center for [facility] at [facility] [address], [address], [place], [place], [address] [phone]  Center for [facility] at [facility] [address], [place], [place], [address] [phone]  Center for [facility] at [facility]                                 [address] [place], [place], [place], [address] [phone]  Center for [facility] at [facility]                                    [address], [place], [place], [address] [phone]  Center for [facility] at [facility] [address], [place], [place], [address]                              [facility] [address], [place], [place], [address] [phone]  Your MyChart E-visit questionnaire answers were reviewed by a board certified advanced clinical practitioner to complete your personal care plan based on your specific symptoms.  Thank you for using e-Visits.     I have spent 5 minutes in review of e-visit questionnaire, review and updating patient chart, medical decision  making and response to patient.   Margaretann Loveless, PA-C

## 2023-11-30 ENCOUNTER — Encounter
